# Patient Record
Sex: Female | Born: 1960 | Race: Black or African American | Hispanic: No | Marital: Married | Smoking: Never smoker
Health system: Southern US, Community
[De-identification: ages and names within clinical notes are randomized; demographics above are authoritative.]

## PROBLEM LIST (undated history)

## (undated) DIAGNOSIS — I10 Essential (primary) hypertension: Secondary | ICD-10-CM

## (undated) DIAGNOSIS — E785 Hyperlipidemia, unspecified: Secondary | ICD-10-CM

## (undated) DIAGNOSIS — G4730 Sleep apnea, unspecified: Secondary | ICD-10-CM

## (undated) DIAGNOSIS — F419 Anxiety disorder, unspecified: Secondary | ICD-10-CM

## (undated) DIAGNOSIS — D219 Benign neoplasm of connective and other soft tissue, unspecified: Secondary | ICD-10-CM

## (undated) HISTORY — DX: Sleep apnea, unspecified: G47.30

## (undated) HISTORY — DX: Anxiety disorder, unspecified: F41.9

## (undated) HISTORY — DX: Essential (primary) hypertension: I10

## (undated) HISTORY — DX: Benign neoplasm of connective and other soft tissue, unspecified: D21.9

## (undated) HISTORY — DX: Hyperlipidemia, unspecified: E78.5

---
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
## 1999-05-04 ENCOUNTER — Other Ambulatory Visit: Admission: RE | Admit: 1999-05-04 | Discharge: 1999-05-04 | Payer: Self-pay | Admitting: Gynecology

## 2000-12-30 ENCOUNTER — Encounter: Admission: RE | Admit: 2000-12-30 | Discharge: 2000-12-30 | Payer: Self-pay | Admitting: Gynecology

## 2000-12-30 ENCOUNTER — Encounter: Payer: Self-pay | Admitting: Gynecology

## 2000-12-30 ENCOUNTER — Other Ambulatory Visit: Admission: RE | Admit: 2000-12-30 | Discharge: 2000-12-30 | Payer: Self-pay | Admitting: Gynecology

## 2002-02-20 ENCOUNTER — Other Ambulatory Visit: Admission: RE | Admit: 2002-02-20 | Discharge: 2002-02-20 | Payer: Self-pay | Admitting: Gynecology

## 2003-03-16 ENCOUNTER — Other Ambulatory Visit: Admission: RE | Admit: 2003-03-16 | Discharge: 2003-03-16 | Payer: Self-pay | Admitting: Gynecology

## 2003-10-26 ENCOUNTER — Ambulatory Visit (HOSPITAL_BASED_OUTPATIENT_CLINIC_OR_DEPARTMENT_OTHER): Admission: RE | Admit: 2003-10-26 | Discharge: 2003-10-26 | Payer: Self-pay | Admitting: Otolaryngology

## 2003-11-17 ENCOUNTER — Encounter: Admission: RE | Admit: 2003-11-17 | Discharge: 2003-11-17 | Payer: Self-pay | Admitting: Gynecology

## 2004-12-12 ENCOUNTER — Ambulatory Visit: Payer: Self-pay | Admitting: Family Medicine

## 2005-01-22 ENCOUNTER — Ambulatory Visit: Payer: Self-pay | Admitting: Internal Medicine

## 2005-02-06 ENCOUNTER — Encounter: Admission: RE | Admit: 2005-02-06 | Discharge: 2005-02-06 | Payer: Self-pay | Admitting: Family Medicine

## 2005-04-23 ENCOUNTER — Ambulatory Visit: Payer: Self-pay | Admitting: Internal Medicine

## 2005-08-08 ENCOUNTER — Other Ambulatory Visit: Admission: RE | Admit: 2005-08-08 | Discharge: 2005-08-08 | Payer: Self-pay | Admitting: Gynecology

## 2005-09-27 ENCOUNTER — Ambulatory Visit: Payer: Self-pay | Admitting: Family Medicine

## 2006-01-01 ENCOUNTER — Ambulatory Visit: Payer: Self-pay | Admitting: Family Medicine

## 2006-02-19 ENCOUNTER — Encounter: Admission: RE | Admit: 2006-02-19 | Discharge: 2006-02-19 | Payer: Self-pay | Admitting: Family Medicine

## 2006-04-04 ENCOUNTER — Ambulatory Visit: Payer: Self-pay | Admitting: Family Medicine

## 2006-04-18 ENCOUNTER — Ambulatory Visit: Payer: Self-pay | Admitting: Family Medicine

## 2006-08-09 ENCOUNTER — Other Ambulatory Visit: Admission: RE | Admit: 2006-08-09 | Discharge: 2006-08-09 | Payer: Self-pay | Admitting: Gynecology

## 2006-09-24 ENCOUNTER — Ambulatory Visit: Payer: Self-pay | Admitting: Family Medicine

## 2007-02-20 DIAGNOSIS — E785 Hyperlipidemia, unspecified: Secondary | ICD-10-CM | POA: Insufficient documentation

## 2007-02-20 DIAGNOSIS — I10 Essential (primary) hypertension: Secondary | ICD-10-CM | POA: Insufficient documentation

## 2007-02-24 ENCOUNTER — Encounter: Admission: RE | Admit: 2007-02-24 | Discharge: 2007-02-24 | Payer: Self-pay | Admitting: Family Medicine

## 2007-02-24 ENCOUNTER — Ambulatory Visit: Payer: Self-pay | Admitting: Family Medicine

## 2007-06-09 ENCOUNTER — Ambulatory Visit: Payer: Self-pay | Admitting: Family Medicine

## 2007-06-09 DIAGNOSIS — H6120 Impacted cerumen, unspecified ear: Secondary | ICD-10-CM

## 2007-08-13 ENCOUNTER — Other Ambulatory Visit: Admission: RE | Admit: 2007-08-13 | Discharge: 2007-08-13 | Payer: Self-pay | Admitting: Gynecology

## 2008-01-23 ENCOUNTER — Telehealth (INDEPENDENT_AMBULATORY_CARE_PROVIDER_SITE_OTHER): Payer: Self-pay | Admitting: *Deleted

## 2008-01-30 ENCOUNTER — Ambulatory Visit: Payer: Self-pay | Admitting: Family Medicine

## 2008-02-25 ENCOUNTER — Ambulatory Visit: Payer: Self-pay | Admitting: Family Medicine

## 2008-02-25 DIAGNOSIS — R599 Enlarged lymph nodes, unspecified: Secondary | ICD-10-CM | POA: Insufficient documentation

## 2008-02-25 LAB — CONVERTED CEMR LAB
ALT: 28 units/L (ref 0–35)
Alkaline Phosphatase: 45 units/L (ref 39–117)
Basophils Absolute: 0 10*3/uL (ref 0.0–0.1)
Bilirubin, Direct: 0.1 mg/dL (ref 0.0–0.3)
CO2: 29 meq/L (ref 19–32)
Calcium: 9.2 mg/dL (ref 8.4–10.5)
Cholesterol: 226 mg/dL (ref 0–200)
Glucose, Bld: 119 mg/dL — ABNORMAL HIGH (ref 70–99)
HDL: 54.4 mg/dL (ref >39.0)
Lymphocytes Relative: 48 % — ABNORMAL HIGH (ref 12.0–46.0)
MCHC: 33.1 g/dL (ref 30.0–36.0)
Monocytes Relative: 6.3 % (ref 3.0–12.0)
Neutro Abs: 2.4 10*3/uL (ref 1.4–7.7)
Neutrophils Relative %: 42.3 % — ABNORMAL LOW (ref 43.0–77.0)
Platelets: 268 10*3/uL (ref 150–400)
Potassium: 3.9 meq/L (ref 3.5–5.1)
RDW: 13.3 % (ref 11.5–14.6)
Sodium: 140 meq/L (ref 135–145)
Total Bilirubin: 0.6 mg/dL (ref 0.3–1.2)
Total CHOL/HDL Ratio: 4.2
Total Protein: 7.1 g/dL (ref 6.0–8.3)
Triglycerides: 55 mg/dL (ref 0–149)
VLDL: 11 mg/dL (ref 0–40)

## 2008-02-26 ENCOUNTER — Encounter (INDEPENDENT_AMBULATORY_CARE_PROVIDER_SITE_OTHER): Payer: Self-pay | Admitting: *Deleted

## 2008-03-03 ENCOUNTER — Encounter: Admission: RE | Admit: 2008-03-03 | Discharge: 2008-03-03 | Payer: Self-pay | Admitting: Family Medicine

## 2008-03-04 ENCOUNTER — Telehealth (INDEPENDENT_AMBULATORY_CARE_PROVIDER_SITE_OTHER): Payer: Self-pay | Admitting: *Deleted

## 2008-05-24 ENCOUNTER — Ambulatory Visit: Payer: Self-pay | Admitting: Family Medicine

## 2008-06-04 ENCOUNTER — Encounter (INDEPENDENT_AMBULATORY_CARE_PROVIDER_SITE_OTHER): Payer: Self-pay | Admitting: *Deleted

## 2008-06-04 ENCOUNTER — Telehealth (INDEPENDENT_AMBULATORY_CARE_PROVIDER_SITE_OTHER): Payer: Self-pay | Admitting: *Deleted

## 2008-08-25 ENCOUNTER — Other Ambulatory Visit: Admission: RE | Admit: 2008-08-25 | Discharge: 2008-08-25 | Payer: Self-pay | Admitting: Gynecology

## 2008-08-25 ENCOUNTER — Ambulatory Visit: Payer: Self-pay | Admitting: Women's Health

## 2008-08-25 ENCOUNTER — Encounter: Payer: Self-pay | Admitting: Women's Health

## 2008-09-24 ENCOUNTER — Ambulatory Visit: Payer: Self-pay | Admitting: Women's Health

## 2008-10-11 ENCOUNTER — Ambulatory Visit: Payer: Self-pay | Admitting: Family Medicine

## 2008-10-13 ENCOUNTER — Ambulatory Visit: Payer: Self-pay | Admitting: Family Medicine

## 2008-10-14 ENCOUNTER — Telehealth (INDEPENDENT_AMBULATORY_CARE_PROVIDER_SITE_OTHER): Payer: Self-pay | Admitting: *Deleted

## 2008-10-31 LAB — CONVERTED CEMR LAB
Bilirubin, Direct: 0.1 mg/dL (ref 0.0–0.3)
Calcium: 9.4 mg/dL (ref 8.4–10.5)
GFR calc Af Amer: 87 mL/min
GFR calc non Af Amer: 72 mL/min
Glucose, Bld: 84 mg/dL (ref 70–99)
HDL: 56.7 mg/dL (ref >39.0)
LDL Cholesterol: 101 mg/dL — ABNORMAL HIGH (ref 0–99)
Sodium: 141 meq/L (ref 135–145)
Total CHOL/HDL Ratio: 3
Total Protein: 7.2 g/dL (ref 6.0–8.3)
VLDL: 10 mg/dL (ref 0–40)

## 2008-11-01 ENCOUNTER — Encounter (INDEPENDENT_AMBULATORY_CARE_PROVIDER_SITE_OTHER): Payer: Self-pay | Admitting: *Deleted

## 2008-11-11 ENCOUNTER — Ambulatory Visit: Payer: Self-pay | Admitting: Internal Medicine

## 2008-11-19 ENCOUNTER — Telehealth (INDEPENDENT_AMBULATORY_CARE_PROVIDER_SITE_OTHER): Payer: Self-pay | Admitting: *Deleted

## 2009-02-17 ENCOUNTER — Telehealth (INDEPENDENT_AMBULATORY_CARE_PROVIDER_SITE_OTHER): Payer: Self-pay | Admitting: *Deleted

## 2009-03-10 ENCOUNTER — Encounter: Admission: RE | Admit: 2009-03-10 | Discharge: 2009-03-10 | Payer: Self-pay | Admitting: Family Medicine

## 2009-04-21 ENCOUNTER — Ambulatory Visit: Payer: Self-pay | Admitting: Family Medicine

## 2009-04-21 DIAGNOSIS — F419 Anxiety disorder, unspecified: Secondary | ICD-10-CM

## 2009-04-24 LAB — CONVERTED CEMR LAB
Albumin: 4.5 g/dL (ref 3.5–5.2)
Bilirubin, Direct: 0.1 mg/dL (ref 0.0–0.3)
CO2: 28 meq/L (ref 19–32)
Calcium: 9.7 mg/dL (ref 8.4–10.5)
Creatinine, Ser: 0.9 mg/dL (ref 0.4–1.2)
Folate: >20 ng/mL
Glucose, Bld: 92 mg/dL (ref 70–99)
HDL: 54.1 mg/dL (ref >39.00)
Total CHOL/HDL Ratio: 3
Total Protein: 8 g/dL (ref 6.0–8.3)
Triglycerides: 50 mg/dL (ref 0.0–149.0)
Vitamin B-12: >1500 pg/mL — ABNORMAL HIGH (ref 211–911)

## 2009-04-25 ENCOUNTER — Encounter (INDEPENDENT_AMBULATORY_CARE_PROVIDER_SITE_OTHER): Payer: Self-pay | Admitting: *Deleted

## 2009-04-27 ENCOUNTER — Telehealth (INDEPENDENT_AMBULATORY_CARE_PROVIDER_SITE_OTHER): Payer: Self-pay | Admitting: *Deleted

## 2009-07-01 ENCOUNTER — Ambulatory Visit: Payer: Self-pay | Admitting: Family Medicine

## 2009-08-26 ENCOUNTER — Encounter: Payer: Self-pay | Admitting: Women's Health

## 2009-08-26 ENCOUNTER — Other Ambulatory Visit: Admission: RE | Admit: 2009-08-26 | Discharge: 2009-08-26 | Payer: Self-pay | Admitting: Gynecology

## 2009-08-26 ENCOUNTER — Ambulatory Visit: Payer: Self-pay | Admitting: Women's Health

## 2010-04-05 ENCOUNTER — Encounter: Admission: RE | Admit: 2010-04-05 | Discharge: 2010-04-05 | Payer: Self-pay | Admitting: Family Medicine

## 2010-06-20 ENCOUNTER — Telehealth (INDEPENDENT_AMBULATORY_CARE_PROVIDER_SITE_OTHER): Payer: Self-pay | Admitting: *Deleted

## 2010-06-26 ENCOUNTER — Ambulatory Visit: Payer: Self-pay | Admitting: Family Medicine

## 2010-08-01 ENCOUNTER — Ambulatory Visit: Payer: Self-pay | Admitting: Family Medicine

## 2010-08-02 ENCOUNTER — Encounter: Payer: Self-pay | Admitting: Family Medicine

## 2010-08-08 ENCOUNTER — Telehealth (INDEPENDENT_AMBULATORY_CARE_PROVIDER_SITE_OTHER): Payer: Self-pay | Admitting: *Deleted

## 2010-08-15 ENCOUNTER — Ambulatory Visit: Payer: Self-pay | Admitting: Family Medicine

## 2010-08-15 DIAGNOSIS — N390 Urinary tract infection, site not specified: Secondary | ICD-10-CM | POA: Insufficient documentation

## 2010-08-18 ENCOUNTER — Ambulatory Visit: Payer: Self-pay | Admitting: Family Medicine

## 2010-08-28 ENCOUNTER — Ambulatory Visit: Payer: Self-pay | Admitting: Women's Health

## 2010-08-28 ENCOUNTER — Other Ambulatory Visit: Admission: RE | Admit: 2010-08-28 | Discharge: 2010-08-28 | Payer: Self-pay | Admitting: Gynecology

## 2010-09-13 ENCOUNTER — Ambulatory Visit: Payer: Self-pay | Admitting: Women's Health

## 2010-12-10 LAB — CONVERTED CEMR LAB
ALT: 19 units/L (ref 0–35)
AST: 19 units/L (ref 0–37)
AST: 19 units/L (ref 0–37)
Albumin: 3.8 g/dL (ref 3.5–5.2)
Alkaline Phosphatase: 36 units/L — ABNORMAL LOW (ref 39–117)
Basophils Absolute: 0 10*3/uL (ref 0.0–0.1)
Basophils Absolute: 0 10*3/uL (ref 0.0–0.1)
Bilirubin, Direct: 0.1 mg/dL (ref 0.0–0.3)
CO2: 23 meq/L (ref 19–32)
CO2: 26 meq/L (ref 19–32)
Chloride: 109 meq/L (ref 96–112)
Chloride: 110 meq/L (ref 96–112)
Cholesterol: 217 mg/dL (ref 0–200)
Eosinophils Absolute: 0.2 10*3/uL (ref 0.0–0.7)
Glucose, Bld: 112 mg/dL — ABNORMAL HIGH (ref 70–99)
Glucose, Bld: 97 mg/dL (ref 70–99)
HCT: 35.8 % — ABNORMAL LOW (ref 36.0–46.0)
HDL: 50.9 mg/dL (ref >39.0)
Hemoglobin: 12.1 g/dL (ref 12.0–15.0)
Lymphocytes Relative: 41.5 % (ref 12.0–46.0)
Lymphs Abs: 2.6 10*3/uL (ref 0.7–4.0)
MCHC: 33.4 g/dL (ref 30.0–36.0)
MCHC: 33.8 g/dL (ref 30.0–36.0)
Monocytes Relative: 5.1 % (ref 3.0–12.0)
Neutro Abs: 3 10*3/uL (ref 1.4–7.7)
Neutrophils Relative %: 50.1 % (ref 43.0–77.0)
Pap Smear: NORMAL
Potassium: 4.2 meq/L (ref 3.5–5.1)
RBC: 4.5 M/uL (ref 3.87–5.11)
RDW: 12.9 % (ref 11.5–14.6)
RDW: 13.9 % (ref 11.5–14.6)
Sodium: 141 meq/L (ref 135–145)
Sodium: 142 meq/L (ref 135–145)
TSH: 1.4 microintl units/mL (ref 0.35–5.50)
TSH: 1.52 microintl units/mL (ref 0.35–5.50)
Total Bilirubin: 0.6 mg/dL (ref 0.3–1.2)
Total CHOL/HDL Ratio: 4.3
VLDL: 12 mg/dL (ref 0–40)

## 2010-12-12 NOTE — Progress Notes (Signed)
Summary: REFILL REQUEST  Phone Note Refill Request Message from:  Patient on June 20, 2010 9:43 AM  Refills Requested: Medication #1:  TOPROL XL 200 MG XR24H-TAB 1 by mouth once daily.   Dosage confirmed as above?Dosage Confirmed   Supply Requested: 1 month [facility]  Next Appointment Scheduled: SEPT 20TH 2011 Initial call taken by: Lavell Islam,  June 20, 2010 9:43 AM  Follow-up for Phone Call        pending cpx 08-01-10...Marland KitchenMarland KitchenFelecia Deloach CMA  June 20, 2010 12:59 PM     Prescriptions: TOPROL XL 200 MG XR24H-TAB (METOPROLOL SUCCINATE) 1 by mouth once daily  #30 x 0   Entered by:   Jeremy Johann CMA   Authorized by:   Loreen Freud DO   Signed by:   Jeremy Johann CMA on 06/20/2010   Method used:   Faxed to ...       [facility]  [place] [phone]* (retail)       [address]       [place], [place]  [address]       Ph: [phone]       Fax: [phone]   RxID:   [id number]

## 2010-12-12 NOTE — Assessment & Plan Note (Signed)
Summary: rto 2 weeks/cbs   Vital Signs:  Patient profile:   50 year old female Weight:      198.6 pounds Pulse rate:   84 / minute Pulse rhythm:   regular BP sitting:   132 / 84  (right arm) Cuff size:   large  Vitals Entered By: [name] CMA Duncan Dull) (August 15, 2010 3:24 PM) CC: 2 week f/u   History of Present Illness: Pt here f/u bp--no complaints.  Current Medications (verified): 1)  Cal Mag D .... 1 By Mouth Once Daily 2)  Mvi .Marland Kitchen.. 1 By Mouth Once Daily 3)  Vitamin C .... 1 By Mouth Once Daily 4)  Toprol Xl 200 Mg Xr24h-Tab (Metoprolol Succinate) .Marland Kitchen.. 1 By Mouth Once Daily 5)  Aspirin 81 Mg Chew (Aspirin) .... By Mouth Once Daily 6)  Coq10 100 Mg Caps (Coenzyme Q10) .... By Mouth Qd 7)  Macrobid 100 Mg Caps (Nitrofurantoin Monohyd Macro) .... Take One Tablet Two Times A Day X7 Days  Allergies (verified): No Known Drug Allergies  Past History:  Past medical, surgical, family and social histories (including risk factors) reviewed for relevance to current acute and chronic problems.  Past Medical History: Reviewed history from 04/21/2009 and no changes required. Hyperlipidemia Hypertension Anxiety  Past Surgical History: Reviewed history from 05/24/2008 and no changes required. R breast BX 1980  Family History: Reviewed history from 02/20/2007 and no changes required. Family History Thyroid disease, sister Family History Diabetes 1st degree relative, F Family History Hypertension, M Family History of Neurological disorder, Alz. dementia  Social History: Reviewed history from 05/24/2008 and no changes required. 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 Lungs are clear to auscultation, no crackles or wheezes. Heart:  Normal rate and regular rhythm. S1 and S2 normal without gallop, murmur, click, rub or other extra sounds. Skin:  Intact without suspicious lesions or rashes Cervical Nodes:  No lymphadenopathy noted Psych:  Cognition and judgment appear intact. Alert and cooperative with normal attention span and concentration. No apparent delusions, illusions, hallucinations   Impression & Recommendations:  Problem # 1:  HYPERTENSION (ICD-401.9)  Her updated medication list for this problem includes:    Toprol Xl 200 Mg Xr24h-tab (Metoprolol succinate) .Marland Kitchen... 1 by mouth once daily  BP today: 132/84 Prior BP: 140/90 (08/01/2010)  Labs Reviewed: K+: 4.2 (08/01/2010) Creat: : 0.8 (08/01/2010)   Chol: 234 (08/01/2010)   HDL: 57.20 (08/01/2010)   LDL: 79 (04/21/2009)   TG: 48.0 (08/01/2010)  Orders: UA Dipstick w/o Micro (manual) (52841)  Problem # 2:  UTI (ICD-599.0) Assessment: Improved  Her updated medication list for this problem includes:    Macrobid 100 Mg Caps (Nitrofurantoin monohyd macro) .Marland Kitchen... Take one tablet two times a day x7 days  Encouraged to push clear liquids, get enough rest, and take acetaminophen as needed. To be seen in 10 days if no improvement, sooner if worse.  Orders: UA Dipstick w/o Micro (manual) (32440)  Complete Medication List: 1)  Cal Mag D  .... 1 by mouth once daily 2)  Mvi  .Marland Kitchen.. 1 by mouth once daily 3)  Vitamin C  .... 1 by mouth once daily 4)  Toprol Xl 200 Mg Xr24h-tab (Metoprolol succinate) .Marland Kitchen.. 1 by mouth once daily  5)  Aspirin 81 Mg Chew (Aspirin) .... By mouth once daily 6)  Coq10 100 Mg Caps (Coenzyme q10) .... By mouth qd 7)  Macrobid 100 Mg Caps (Nitrofurantoin monohyd macro) .... Take one tablet two times a day x7 days  Patient Instructions: 1)  Please schedule a follow-up appointment in 3 months .   Appended Document: Orders Update    Clinical Lists  Changes  Observations: Added new observation of PH URINE: 5.0  (08/16/2010 8:39) Added new observation of SPEC GR URIN: 1.010  (08/16/2010 8:39) Added new observation of APPEARANCE U: Clear  (08/16/2010 8:39) Added new observation of UA COLOR: lt. yellow  (08/16/2010 8:39) Added new observation of WBC DIPSTK U: negative  (08/16/2010 8:39) Added new observation of NITRITE URN: negative  (08/16/2010 8:39) Added new observation of UROBILINOGEN: 0.2  (08/16/2010 8:39) Added new observation of PROTEIN, URN: trace  (08/16/2010 8:39) Added new observation of BLOOD UR DIP: negative  (08/16/2010 8:39) Added new observation of KETONES URN: negative  (08/16/2010 8:39) Added new observation of BILIRUBIN UR: negative  (08/16/2010 8:39) Added new observation of GLUCOSE, URN: negative  (08/16/2010 8:39)      Laboratory Results   Urine Tests    Routine Urinalysis   Color: lt. yellow Appearance: Clear Glucose: negative   (Normal Range: Negative) Bilirubin: negative   (Normal Range: Negative) Ketone: negative   (Normal Range: Negative) Spec. Gravity: 1.010   (Normal Range: 1.003-1.035) Blood: negative   (Normal Range: Negative) pH: 5.0   (Normal Range: 5.0-8.0) Protein: trace   (Normal Range: Negative) Urobilinogen: 0.2   (Normal Range: 0-1) Nitrite: negative   (Normal Range: Negative) Leukocyte Esterace: negative   (Normal Range: Negative)

## 2010-12-12 NOTE — Progress Notes (Signed)
Summary: change atb  Phone Note Call from Patient Call back at Work Phone [phone]   Caller: Patient Summary of Call: patient called says that she think cipro may be to strong would like to have another option to treat uti Initial call taken by: Doristine Devoid CMA,  August 08, 2010 1:31 PM  Follow-up for Phone Call        macrobid 1 by mouth two times a day for 7 days  Follow-up by: Loreen Freud DO,  August 08, 2010 1:32 PM  Additional Follow-up for Phone Call Additional follow up Details #1::        spoke w/ patient aware medication to be changed and sent to pharmacy .....Marland KitchenMarland KitchenDoristine Devoid CMA  August 08, 2010 1:49 PM     New/Updated Medications: MACROBID 100 MG CAPS (NITROFURANTOIN MONOHYD MACRO) take one tablet two times a day x7 days Prescriptions: MACROBID 100 MG CAPS (NITROFURANTOIN MONOHYD MACRO) take one tablet two times a day x7 days  #14 x 0   Entered by:   Doristine Devoid CMA   Authorized by:   Loreen Freud DO   Signed by:   Doristine Devoid CMA on 08/08/2010   Method used:   Electronically to        [id number]* (retail)       [address]       [place], [place]  [address]       Ph: [phone]       Fax: [phone]   RxID:   [id number]

## 2010-12-12 NOTE — Letter (Signed)
Summary: [facility]: Immunoassay Fecal Occult Blood (iFOB) Order Form  [facility] [place]  [address] [place], [place] [address]   Phone: [phone]  Fax: [phone]      [facility]: Immunoassay Fecal Occult Blood (iFOB) Order Form   August 01, 2010 MRN: [id number]   Jodi Rodriguez [birth year]   Physicican Name: Dr.Lowne  Diagnosis Code: V56.71      [name] CMA (AAMA)

## 2010-12-12 NOTE — Assessment & Plan Note (Signed)
Summary: BOTH EARS "CLOGGY"--NO DRAINAGE///SPH   Vital Signs:  Patient profile:   50 year old female Height:      61 inches (154.94 cm) Weight:      193.38 pounds (87.90 kg) BMI:     36.67 Temp:     99.2 degrees F (37.33 degrees C) oral BP sitting:   140 / 88  (right arm) Cuff size:   large  Vitals Entered By: Lucious Groves CMA (June 26, 2010 2:28 PM) CC: C/O ears being clogged x3-4 days./kb Is Patient Diabetic? No Pain Assessment Patient in pain? no      Comments Patient denies drainage, HA, and fever. Patient also notes that her Toprol is 200mg  and she is only taking half b/c she was previously on 100mg ./kb   History of Present Illness: 50 yo woman here today for 'ears are clogged'.  sxs started 2 weeks ago.  no drainage from the ears.  denies pain.  'they just feel full'.  no HAs, dizziness, nausea.  no hx of seasonal allergies.  no fevers, chills.  hx of cerumen impaction- feels similar.  R ear worse.  reviewed med list- pt was taking Toprol 200mg  daily.  will continue this.  Problems Prior to Update: 1)  Anxiety  (ICD-300.00) 2)  Preventive Health Care  (ICD-V70.0) 3)  Cervical Lymphadenopathy, Left  (ICD-785.6) 4)  Cerumen Impaction, Bilateral  (ICD-380.4) 5)  Cerumen Impaction, Right  (ICD-380.4) 6)  Family History Diabetes 1st Degree Relative  (ICD-V18.0) 7)  Breast Biopsy, Hx Of, R  (ICD-V15.9) 8)  Hypertension  (ICD-401.9) 9)  Hyperlipidemia  (ICD-272.4)  Current Medications (verified): 1)  Cal Mag D .... 1 By Mouth Once Daily 2)  Mvi .Marland Kitchen.. 1 By Mouth Once Daily 3)  Vitamin C .... 1 By Mouth Once Daily 4)  Toprol Xl 200 Mg Xr24h-Tab (Metoprolol Succinate) .Marland Kitchen.. 1 By Mouth Once Daily  Allergies (verified): No Known Drug Allergies  Past History:  Past Medical History: Last updated: 04/21/2009 Hyperlipidemia Hypertension Anxiety  Review of Systems      See HPI  Physical Exam  General:  Well-developed,well-nourished,in no acute distress;  alert,appropriate and cooperative throughout examination Ears:  ears b/l + cerumen---curette ineffective, irrigated with good success=== mild errythma of canal, TM's normal   Impression & Recommendations:  Problem # 1:  CERUMEN IMPACTION, BILATERAL (ICD-380.4) Assessment Unchanged  currette was unsuccessful, but ears irrigated successfully debrox as needed rto prn  Orders: Cerumen Impaction Removal ([address])  Problem # 2:  HYPERTENSION (ICD-401.9) Assessment: Unchanged reviewed med list, clarified for pt.  she will continue 200mg  daily Her updated medication list for this problem includes:    Toprol Xl 200 Mg Xr24h-tab (Metoprolol succinate) .Marland Kitchen... 1 by mouth once daily  Complete Medication List: 1)  Cal Mag D  .... 1 by mouth once daily 2)  Mvi  .Marland Kitchen.. 1 by mouth once daily 3)  Vitamin C  .... 1 by mouth once daily 4)  Toprol Xl 200 Mg Xr24h-tab (Metoprolol succinate) .Marland Kitchen.. 1 by mouth once daily  Patient Instructions: 1)  Follow up as needed or as scheduled per Dr Laury Axon 2)  Call with any questions or concerns 3)  Hang in there!!

## 2010-12-12 NOTE — Assessment & Plan Note (Signed)
Summary: CPX/FASTING//KN   Vital Signs:  Patient profile:   50 year old female Height:      61 inches Weight:      199.2 pounds Temp:     98.9 degrees F oral Pulse rate:   68 / minute Pulse rhythm:   regular BP sitting:   140 / 90  (right arm)  Vitals Entered By: [name] CMA Duncan Dull) (August 01, 2010 9:33 AM)  Serial Vital Signs/Assessments:  Time      Position  BP       Pulse  Resp  Temp     By 9:55 AM             160/100                        Loreen Freud DO  CC: cpx/fasting pap not needed   History of Present Illness: pt here for cpe and labs.  Pt sees [facility] gyn for pap etc.  No complaints.    Hyperlipidemia follow-up      This is a 50 year old woman who presents for Hyperlipidemia follow-up.  The patient denies muscle aches, GI upset, abdominal pain, flushing, itching, constipation, diarrhea, and fatigue.  The patient denies the following symptoms: chest pain/pressure, exercise intolerance, dypsnea, palpitations, syncope, and pedal edema.  Compliance with medications (by patient report) has been near 100%.  Dietary compliance has been good.  The patient reports no exercise.  Adjunctive measures currently used by the patient include ASA.    Hypertension follow-up      The patient also presents for Hypertension follow-up.  The patient denies lightheadedness, urinary frequency, headaches, edema, impotence, rash, and fatigue.  The patient denies the following associated symptoms: chest pain, chest pressure, exercise intolerance, dyspnea, palpitations, syncope, leg edema, and pedal edema.  Compliance with medications (by patient report) has been sporadic.  The patient reports that dietary compliance has been good.  The patient reports no exercise.  Adjunctive measures currently used by the patient include salt restriction.    Preventive Screening-Counseling & Management  Alcohol-Tobacco     Alcohol drinks/day: <1     Alcohol type: socially-- wine     Smoking Status:  never     Passive Smoke Exposure: no  Caffeine-Diet-Exercise     Caffeine use/day: 1     Does Patient Exercise: no     Exercise Counseling: to improve exercise regimen  Hep-HIV-STD-Contraception     HIV Risk: no     Dental Visit-last 6 months yes     Dental Care Counseling: to seek dental care; no dental care within six months     SBE monthly: yes     SBE Education/Counseling: not indicated; SBE done regularly  Safety-Violence-Falls     Seat Belt Use: 100      Sexual History:  currently monogamous.        Drug Use:  no.    Current Medications (verified): 1)  Cal Mag D .... 1 By Mouth Once Daily 2)  Mvi .Marland Kitchen.. 1 By Mouth Once Daily 3)  Vitamin C .... 1 By Mouth Once Daily 4)  Toprol Xl 200 Mg Xr24h-Tab (Metoprolol Succinate) .Marland Kitchen.. 1 By Mouth Once Daily 5)  Aspirin 81 Mg Chew (Aspirin) .... By Mouth Once Daily 6)  Coq10 100 Mg Caps (Coenzyme Q10) .... By Mouth Qd  Allergies (verified): No Known Drug Allergies  Past History:  Past Medical History: Last updated: 04/21/2009 Hyperlipidemia Hypertension Anxiety  Past  Surgical History: Last updated: 05/24/2008 R breast BX 1980  Family History: Last updated: 02/20/2007 Family History Thyroid disease, sister Family History Diabetes 1st degree relative, F Family History Hypertension, M Family History of Neurological disorder, Alz. 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 Family History Thyroid disease, sister Family History Diabetes 1st degree relative, F Family History Hypertension, M Family History of Neurological disorder,  Alz. dementia  Social History: Reviewed history from 05/24/2008 and no changes required. 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 Eyes:  Denies blurring, discharge, double vision, eye irritation, eye pain, halos, itching, light sensitivity, red eye, vision loss-1 eye, and vision loss-both eyes; optho--last one 3 years ago. ENT:  Denies decreased hearing, difficulty swallowing, ear discharge, earache, hoarseness, nasal congestion, nosebleeds, postnasal drainage, ringing in ears, sinus pressure, and sore throat. CV:  Denies bluish discoloration of lips or nails, chest pain or discomfort, difficulty breathing at night, difficulty breathing while lying down, fainting, fatigue, leg cramps with exertion, lightheadness, near fainting, palpitations, shortness of breath with exertion, swelling of feet, swelling of hands, and weight gain. Resp:  Denies chest discomfort, chest pain with inspiration, cough, coughing up blood, excessive snoring, hypersomnolence, morning headaches, pleuritic, shortness of breath, sputum productive, and wheezing. GI:  Denies abdominal pain, bloody stools, change in bowel habits, constipation, dark tarry stools, diarrhea, excessive appetite, gas, hemorrhoids, indigestion, loss of appetite, nausea, vomiting, vomiting blood, and yellowish skin color. GU:  Denies abnormal vaginal bleeding, decreased libido, discharge, dysuria, genital sores, hematuria, incontinence, nocturia, urinary frequency, and urinary hesitancy. MS:  Denies joint pain, joint redness, joint swelling, loss of strength, low back pain, mid back pain, muscle aches, muscle , cramps, muscle weakness,  stiffness, and thoracic pain. Derm:  Denies changes in color of skin, changes in nail beds, dryness, excessive perspiration, flushing, hair loss, insect bite(s), itching, lesion(s), poor wound healing, and rash. Neuro:  Denies brief paralysis, difficulty with concentration, disturbances in coordination, falling down, headaches, inability to speak, memory loss, numbness, poor balance, seizures, sensation of room spinning, tingling, tremors, visual disturbances, and weakness. Psych:  Denies alternate hallucination ( auditory/visual), anxiety, depression, easily angered, easily tearful, irritability, mental problems, panic attacks, sense of great danger, suicidal thoughts/plans, thoughts of violence, unusual visions or sounds, and thoughts /plans of harming others. Endo:  Denies cold intolerance, excessive hunger, excessive thirst, excessive urination, heat intolerance, polyuria, and weight change. Heme:  Denies abnormal bruising, bleeding, enlarge lymph nodes, fevers, pallor, and skin discoloration. Allergy:  Denies hives or rash, itching eyes, persistent infections, seasonal allergies, and sneezing.  Physical Exam  General:  Well-developed,well-nourished,in no acute distress; alert,appropriate and cooperative throughout examination Head:  Normocephalic and atraumatic without obvious abnormalities. No apparent alopecia or balding. Eyes:  vision grossly intact, pupils equal, pupils round, and pupils reactive to light.   Ears:  External ear exam shows no significant lesions or deformities.  Otoscopic examination reveals clear  canals, tympanic membranes are intact bilaterally without bulging, retraction, inflammation or discharge. Hearing is grossly normal bilaterally. Nose:  External nasal examination shows no deformity or inflammation. Nasal mucosa are pink and moist without lesions or exudates. Mouth:  Oral mucosa and oropharynx without lesions or exudates.  Teeth in good repair. Neck:  No deformities,  masses, or tenderness noted. Chest Wall:  No deformities, masses, or tenderness noted. Breasts:  gyn Lungs:  Normal respiratory effort, chest expands symmetrically. Lungs are clear to auscultation, no crackles or wheezes. Heart:  Normal rate and regular rhythm. S1 and S2 normal without gallop, murmur, click, rub or other extra sounds. Abdomen:  Bowel sounds positive,abdomen soft and non-tender without masses, organomegaly or hernias noted. Rectal:  gyn Genitalia:  gyn Msk:  normal ROM, no joint tenderness, no joint swelling, no joint warmth, no redness over joints, no joint deformities, no joint instability, and no crepitation.   Pulses:  R posterior tibial normal, R dorsalis pedis normal, R carotid normal, L posterior tibial normal, L dorsalis pedis normal, and L carotid normal.   Extremities:  No clubbing, cyanosis, edema, or deformity noted with normal full range of motion of all joints.   Neurologic:  No cranial nerve deficits noted. Station and gait are normal. Plantar reflexes are down-going bilaterally. DTRs are symmetrical throughout. Sensory, motor and coordinative functions appear intact. Skin:  Intact without suspicious lesions or rashes Cervical Nodes:  No lymphadenopathy noted Axillary Nodes:  No palpable lymphadenopathy Psych:  Cognition and judgment appear intact. Alert and cooperative with normal attention span and concentration. No apparent delusions, illusions, hallucinations   Impression & Recommendations:  Problem # 1:  PREVENTIVE HEALTH CARE (ICD-V70.0) ghm utd  Orders: Venipuncture (95621) TLB-Lipid Panel (80061-LIPID) TLB-BMP (Basic Metabolic Panel-BMET) (80048-METABOL) TLB-CBC Platelet - w/Differential (85025-CBCD) TLB-Hepatic/Liver Function Pnl (80076-HEPATIC) TLB-TSH (Thyroid Stimulating Hormone) (84443-TSH) Specimen Handling (30865) EKG w/ Interpretation (93000)  Problem # 2:  HYPERTENSION (ICD-401.9)  Her updated medication list for this problem  includes:    Toprol Xl 200 Mg Xr24h-tab (Metoprolol succinate) .Marland Kitchen... 1 by mouth once daily  Orders: Venipuncture (78469) TLB-Lipid Panel (80061-LIPID) TLB-BMP (Basic Metabolic Panel-BMET) (80048-METABOL) TLB-CBC Platelet - w/Differential (85025-CBCD) TLB-Hepatic/Liver Function Pnl (80076-HEPATIC) TLB-TSH (Thyroid Stimulating Hormone) (84443-TSH) Specimen Handling (62952) EKG w/ Interpretation (93000)  BP today: 140/90 Prior BP: 140/88 (06/26/2010)  Labs Reviewed: K+: 3.5 (04/21/2009) Creat: : 0.9 (04/21/2009)   Chol: 143 (04/21/2009)   HDL: 54.10 (04/21/2009)   LDL: 79 (04/21/2009)   TG: 50.0 (04/21/2009)  Problem # 3:  HYPERLIPIDEMIA (ICD-272.4)  Orders: Venipuncture (84132) TLB-Lipid Panel (80061-LIPID) TLB-BMP (Basic Metabolic Panel-BMET) (80048-METABOL) TLB-CBC Platelet - w/Differential (85025-CBCD) TLB-Hepatic/Liver Function Pnl (80076-HEPATIC) TLB-TSH (Thyroid Stimulating Hormone) (84443-TSH) Specimen Handling (44010) EKG w/ Interpretation (93000)  Labs Reviewed: SGOT: 41 (04/21/2009)   SGPT: 60 (04/21/2009)   HDL:54.10 (04/21/2009), 56.7 (10/13/2008)  LDL:79 (04/21/2009), 101 (27/25/3664)  Chol:143 (04/21/2009), 168 (10/13/2008)  Trig:50.0 (04/21/2009), 52 (10/13/2008)  Complete Medication List: 1)  Cal Mag D  .... 1 by mouth once daily 2)  Mvi  .Marland Kitchen.. 1 by mouth once daily 3)  Vitamin C  .... 1 by mouth once daily 4)  Toprol Xl 200 Mg Xr24h-tab (Metoprolol succinate) .Marland Kitchen.. 1 by mouth once daily 5)  Aspirin 81 Mg Chew (Aspirin) .... By mouth once daily 6)  Coq10 100 Mg Caps (Coenzyme q10) .... By mouth qd  Patient Instructions: 1)  Please schedule a follow-up appointment in 2 weeks.  Prescriptions: TOPROL XL 200 MG XR24H-TAB (METOPROLOL SUCCINATE) 1 by mouth  once daily  #90 x 1   Entered and Authorized by:   Loreen Freud DO   Signed by:   Loreen Freud DO on 08/01/2010   Method used:   Electronically to        [id number] (retail)        [address]       [place], [place]  [address]       Ph: [phone]       Fax: [phone]   RxID:   [id number]    EKG  Procedure date:  08/01/2010  Findings:      sinus arrhythmia  borderline 1st degree AV block  Flu Vaccine Next Due:  Refused Last PAP:  Normal (01/21/2008 9:42:28 AM) PAP Result Date:  07/27/2009 PAP Result:  normal PAP Next Due:  1 yr   Appended Document: CPX/FASTING//KN  Laboratory Results   Urine Tests   Date/Time Reported: August 01, 2010 1:02 PM   Routine Urinalysis   Color: yellow Appearance: Clear Glucose: negative   (Normal Range: Negative) Bilirubin: negative   (Normal Range: Negative) Ketone: negative   (Normal Range: Negative) Spec. Gravity: 1.020   (Normal Range: 1.003-1.035) Blood: large   (Normal Range: Negative) pH: 6.0   (Normal Range: 5.0-8.0) Protein: negative   (Normal Range: Negative) Urobilinogen: negative   (Normal Range: 0-1) Nitrite: negative   (Normal Range: Negative) Leukocyte Esterace: negative   (Normal Range: Negative)    Comments: Floydene Flock  August 01, 2010 1:03 PM pt w/menses

## 2011-02-26 ENCOUNTER — Other Ambulatory Visit: Payer: Self-pay

## 2011-02-26 MED ORDER — METOPROLOL SUCCINATE ER 200 MG PO TB24: 200.0000 mg | ORAL_TABLET | Freq: Every day | ORAL | Status: DC

## 2011-03-15 ENCOUNTER — Other Ambulatory Visit: Payer: Self-pay | Admitting: Family Medicine

## 2011-03-15 DIAGNOSIS — Z1231 Encounter for screening mammogram for malignant neoplasm of breast: Secondary | ICD-10-CM

## 2011-03-21 ENCOUNTER — Encounter: Payer: Self-pay | Admitting: Family Medicine

## 2011-03-22 ENCOUNTER — Ambulatory Visit (INDEPENDENT_AMBULATORY_CARE_PROVIDER_SITE_OTHER): Payer: BC Managed Care – PPO | Admitting: Family Medicine

## 2011-03-22 DIAGNOSIS — H6120 Impacted cerumen, unspecified ear: Secondary | ICD-10-CM

## 2011-03-22 NOTE — Assessment & Plan Note (Signed)
Wax removed bilaterally w/ irrigation.  Hearing now normal.  TMs normal.  No pain.

## 2011-03-22 NOTE — Patient Instructions (Signed)
Follow up w/ Dr Laury Axon sometime in the next 3 months to recheck blood pressure Call w/ any questions or concerns Hang in there!

## 2011-03-22 NOTE — Progress Notes (Signed)
  Subjective:    Patient ID: Jodi Rodriguez, female    DOB: December 01, 1960, 50 y.o.   MRN: [id number]  HPI Cerumen impaction- R>L, decreased hearing started Monday but 'they've been sticky' for the last few weeks'.  Recurrent problem for pt.  No pain.   Review of Systems For ROS see HPI     Objective:   Physical Exam  Constitutional: She appears well-developed and well-nourished. No distress.  HENT:  Head: Normocephalic and atraumatic.  Right Ear: Decreased hearing is noted.  Left Ear: Decreased hearing is noted.       TMs obscured by soft wax bilaterally After irrigation TMs normal bilaterally          Assessment & Plan:

## 2011-04-10 ENCOUNTER — Ambulatory Visit
Admission: RE | Admit: 2011-04-10 | Discharge: 2011-04-10 | Disposition: A | Discharge status: Home / Self Care | Payer: BC Managed Care – PPO | Source: Ambulatory Visit | Attending: Family Medicine | Admitting: Family Medicine

## 2011-04-10 DIAGNOSIS — Z1231 Encounter for screening mammogram for malignant neoplasm of breast: Secondary | ICD-10-CM

## 2011-06-11 ENCOUNTER — Other Ambulatory Visit: Payer: Self-pay | Admitting: *Deleted

## 2011-06-11 MED ORDER — METOPROLOL SUCCINATE ER 200 MG PO TB24: 200.0000 mg | ORAL_TABLET | Freq: Every day | ORAL | Status: DC

## 2011-06-11 NOTE — Telephone Encounter (Signed)
Pt left VM that she ran out of med this weekend and mail order is not to arrive until Thursday. Pt would like to know if we have samples of this med that she can have. Pt advise that we do not sample this med but I can send Rx to local pharmacy. Pt ok Rx sent to local pharmacy.

## 2011-08-15 ENCOUNTER — Other Ambulatory Visit: Payer: Self-pay | Admitting: Family Medicine

## 2011-08-15 NOTE — Telephone Encounter (Signed)
Leer mailed to schedule CPE     KP

## 2011-08-22 DIAGNOSIS — D219 Benign neoplasm of connective and other soft tissue, unspecified: Secondary | ICD-10-CM | POA: Insufficient documentation

## 2011-08-31 ENCOUNTER — Encounter: Payer: Self-pay | Admitting: Women's Health

## 2011-08-31 ENCOUNTER — Ambulatory Visit (INDEPENDENT_AMBULATORY_CARE_PROVIDER_SITE_OTHER): Payer: BC Managed Care – PPO | Admitting: Women's Health

## 2011-08-31 ENCOUNTER — Other Ambulatory Visit (HOSPITAL_COMMUNITY)
Admission: RE | Admit: 2011-08-31 | Discharge: 2011-08-31 | Disposition: A | Discharge status: Home / Self Care | Payer: BC Managed Care – PPO | Source: Ambulatory Visit | Attending: Women's Health | Admitting: Women's Health

## 2011-08-31 VITALS — BP 130/82 | Ht 61.25 in | Wt 205.0 lb

## 2011-08-31 DIAGNOSIS — Z01419 Encounter for gynecological examination (general) (routine) without abnormal findings: Secondary | ICD-10-CM | POA: Insufficient documentation

## 2011-08-31 NOTE — Progress Notes (Signed)
Jodi Rodriguez [birth year] [id number]    History:    The patient presents for annual exam.  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 Son at [employer] on football scholarship. Daughter junior high school doing well. Both have received Gardasil.  Past medical history, past surgical history, family history and social history were all reviewed and documented in the EPIC chart.   ROS:  A  ROS was performed and pertinent positives and negatives are included in the history.  Exam:  Filed Vitals:   08/31/11 0945  BP: 130/82    General appearance:  Normal Head/Neck:  Normal, without cervical or supraclavicular adenopathy. Thyroid:  Symmetrical, normal in size, without palpable masses or nodularity. Respiratory  Effort:  Normal  Auscultation:  Clear without wheezing or rhonchi Cardiovascular  Auscultation:  Regular rate, without rubs, murmurs or gallops  Edema/varicosities:  Not grossly evident Abdominal  Soft,nontender, without masses, guarding or rebound.  Liver/spleen:  No organomegaly noted  Hernia:  None appreciated  Skin  Inspection:  Grossly normal  Palpation:  Grossly normal Neurologic/psychiatric  Orientation:  Normal with appropriate conversation.  Mood/affect:  Normal  Genitourinary    Breasts: Examined lying and sitting.     Right: Without masses, retractions, discharge or axillary adenopathy.     Left: Without masses, retractions, discharge or axillary adenopathy.   Inguinal/mons:  Normal without inguinal adenopathy  External genitalia:  Normal  BUS/Urethra/Skene's glands:  Normal  Bladder:  Normal  Vagina:  Normal  Cervix:  Normal  Uterus:  Enlarged/fibroids 16 wk size  Midline and mobile  Adnexa/parametria:     Rt: Without masses or tenderness.   Lt: Without masses or tenderness.  Anus and perineum: Normal  Digital rectal exam: Normal sphincter tone without palpated masses or tenderness  Assessment/Plan:  50 y.o. MBF G2P2 for annual exam monthly  4-6 day cycle/vasectomy. States get a cycle in September, denies any menopausal symptoms at this time. Menopause reviewed.  Fibroid uterus Obesity Hypertension  Plan: Labs and medications at her primary care. Pap only. SBEs, annual mammogram which was normal in May. Reviewed importance of exercise, cutting calories for weight loss. Encouraged calcium rich diet, vitamin D 1000 daily.    Harrington Challenger WHNP, 1:09 PM 08/31/2011

## 2011-10-08 ENCOUNTER — Encounter: Payer: Self-pay | Admitting: Family Medicine

## 2011-10-08 ENCOUNTER — Ambulatory Visit (INDEPENDENT_AMBULATORY_CARE_PROVIDER_SITE_OTHER): Payer: BC Managed Care – PPO | Admitting: Family Medicine

## 2011-10-08 VITALS — BP 124/82 | HR 70 | Temp 98.3°F | Ht 62.5 in | Wt 208.0 lb

## 2011-10-08 DIAGNOSIS — I10 Essential (primary) hypertension: Secondary | ICD-10-CM

## 2011-10-08 DIAGNOSIS — Z0000 Encounter for general adult medical examination without abnormal findings: Secondary | ICD-10-CM

## 2011-10-08 LAB — TSH: TSH: 1.85 u[IU]/mL (ref 0.35–5.50)

## 2011-10-08 LAB — LIPID PANEL
HDL: 60.2 mg/dL (ref >39.00)
Total CHOL/HDL Ratio: 4
VLDL: 12.6 mg/dL (ref 0.0–40.0)

## 2011-10-08 LAB — CBC WITH DIFFERENTIAL/PLATELET
Basophils Absolute: 0 10*3/uL (ref 0.0–0.1)
Hemoglobin: 12.4 g/dL (ref 12.0–15.0)
Lymphocytes Relative: 42.9 % (ref 12.0–46.0)
Monocytes Relative: 6.2 % (ref 3.0–12.0)
Neutro Abs: 2.7 10*3/uL (ref 1.4–7.7)
Neutrophils Relative %: 48 % (ref 43.0–77.0)
Platelets: 246 10*3/uL (ref 150.0–400.0)
RDW: 14 % (ref 11.5–14.6)

## 2011-10-08 LAB — POCT URINALYSIS DIPSTICK
Glucose, UA: NEGATIVE
Nitrite, UA: NEGATIVE
Protein, UA: NEGATIVE
Urobilinogen, UA: 0.2

## 2011-10-08 LAB — LDL CHOLESTEROL, DIRECT: Direct LDL: 185.6 mg/dL

## 2011-10-08 LAB — BASIC METABOLIC PANEL
Calcium: 9 mg/dL (ref 8.4–10.5)
Creatinine, Ser: 0.8 mg/dL (ref 0.4–1.2)
GFR: 106 mL/min (ref >60.00)
Glucose, Bld: 97 mg/dL (ref 70–99)
Sodium: 140 mEq/L (ref 135–145)

## 2011-10-08 LAB — HEPATIC FUNCTION PANEL
AST: 19 U/L (ref 0–37)
Alkaline Phosphatase: 46 U/L (ref 39–117)
Bilirubin, Direct: 0 mg/dL (ref 0.0–0.3)
Total Bilirubin: 0.6 mg/dL (ref 0.3–1.2)

## 2011-10-08 MED ORDER — METOPROLOL SUCCINATE ER 200 MG PO TB24: ORAL_TABLET | ORAL | Status: DC

## 2011-10-08 NOTE — Patient Instructions (Signed)

## 2011-10-08 NOTE — Progress Notes (Signed)
  Subjective:     Jodi Rodriguez is a 50 y.o. female and is here for a comprehensive physical exam. The patient reports no problems.  History   Social History  . Marital Status: Married    Spouse Name: N/A    Number of Children: N/A  . Years of Education: N/A   Occupational History  . Insurance    Social History Main Topics  . Smoking status: Never Smoker   . Smokeless tobacco: Never Used  . Alcohol Use: Yes  . Drug Use: No  . Sexually Active: Yes -- Female partner(s)    Birth Control/ Protection: Surgical     vasectomy   Other Topics Concern  . Not on file   Social History Narrative  . No narrative on file   Health Maintenance  Topic Date Due  . Influenza Vaccine  08/12/2012  . Pap Smear  08/30/2014  . Tetanus/tdap  05/24/2018    The following portions of the patient's history were reviewed and updated as appropriate: allergies, current medications, past family history, past medical history, past social history, past surgical history and problem list.  Review of Systems Review of Systems  Constitutional: Negative for activity change, appetite change and fatigue.  HENT: Negative for hearing loss, congestion, tinnitus and ear discharge.  dentist q2m Eyes: Negative for visual disturbance (see optho q2y)  Respiratory: Negative for cough, chest tightness and shortness of breath.   Cardiovascular: Negative for chest pain, palpitations and leg swelling.  Gastrointestinal: Negative for abdominal pain, diarrhea, constipation and abdominal distention.  Genitourinary: Negative for urgency, frequency, decreased urine volume and difficulty urinating.  Musculoskeletal: Negative for back pain, arthralgias and gait problem.  Skin: Negative for color change, pallor and rash.  Neurological: Negative for dizziness, light-headedness, numbness and headaches.  Hematological: Negative for adenopathy. Does not bruise/bleed easily.  Psychiatric/Behavioral: Negative for suicidal ideas,  confusion, sleep disturbance, self-injury, dysphoric mood, decreased concentration and agitation.       Objective:    BP 124/82  Pulse 70  Temp(Src) 98.3 F (36.8 C) (Oral)  Ht 5' 2.5" (1.588 m)  Wt 208 lb (94.348 kg)  BMI 37.44 kg/m2  SpO2 97% General appearance: alert, cooperative, appears stated age and moderately obese Head: Normocephalic, without obvious abnormality, atraumatic Eyes: conjunctivae/corneas clear. PERRL, EOM's intact. Fundi benign. Ears: normal TM's and external ear canals both ears--b/l cerumena impaction--irrigated with no complications Nose: Nares normal. Septum midline. Mucosa normal. No drainage or sinus tenderness. Throat: lips, mucosa, and tongue normal; teeth and gums normal Neck: no adenopathy, no carotid bruit, no JVD, supple, symmetrical, trachea midline and thyroid not enlarged, symmetric, no tenderness/mass/nodules Back: symmetric, no curvature. ROM normal. No CVA tenderness. Lungs: clear to auscultation bilaterally Breasts: gyn--Fernandez Heart: regular rate and rhythm, S1, S2 normal, no murmur, click, rub or gallop Abdomen: soft, non-tender; bowel sounds normal; no masses,  no organomegaly Pelvic: gyn Extremities: extremities normal, atraumatic, no cyanosis or edema Pulses: 2+ and symmetric Skin: Skin color, texture, turgor normal. No rashes or lesions Lymph nodes: Cervical, supraclavicular, and axillary nodes normal. Neurologic: Alert and oriented X 3, normal strength and tone. Normal symmetric reflexes. Normal coordination and gait psych--- no depression/ anxiety    Assessment:    Healthy female exam.   HTN--stable     Plan:  Check fasting labs ghm utd    See After Visit Summary for Counseling Recommendations

## 2011-10-18 MED ORDER — ATORVASTATIN CALCIUM 20 MG PO TABS: 20.0000 mg | ORAL_TABLET | Freq: Every day | ORAL | Status: AC

## 2011-10-31 ENCOUNTER — Telehealth: Payer: Self-pay | Admitting: Family Medicine

## 2011-10-31 DIAGNOSIS — I10 Essential (primary) hypertension: Secondary | ICD-10-CM

## 2011-10-31 MED ORDER — METOPROLOL SUCCINATE ER 200 MG PO TB24: ORAL_TABLET | ORAL | Status: DC

## 2011-10-31 NOTE — Telephone Encounter (Signed)
Patient had cpx last month she thought she said to sent refill for topral to [facility]  - 3 month supply

## 2011-10-31 NOTE — Telephone Encounter (Signed)
RE-FAXED     KP

## 2012-03-26 ENCOUNTER — Other Ambulatory Visit: Payer: Self-pay | Admitting: Family Medicine

## 2012-03-26 DIAGNOSIS — Z1231 Encounter for screening mammogram for malignant neoplasm of breast: Secondary | ICD-10-CM

## 2012-04-10 ENCOUNTER — Ambulatory Visit
Admission: RE | Admit: 2012-04-10 | Discharge: 2012-04-10 | Disposition: A | Discharge status: Home / Self Care | Payer: BC Managed Care – PPO | Source: Ambulatory Visit | Attending: Family Medicine | Admitting: Family Medicine

## 2012-04-10 DIAGNOSIS — Z1231 Encounter for screening mammogram for malignant neoplasm of breast: Secondary | ICD-10-CM

## 2012-08-07 ENCOUNTER — Ambulatory Visit (INDEPENDENT_AMBULATORY_CARE_PROVIDER_SITE_OTHER): Payer: BC Managed Care – PPO | Admitting: Family Medicine

## 2012-08-07 ENCOUNTER — Encounter: Payer: Self-pay | Admitting: Family Medicine

## 2012-08-07 VITALS — BP 122/80 | HR 70 | Temp 98.4°F | Wt 187.6 lb

## 2012-08-07 DIAGNOSIS — H6120 Impacted cerumen, unspecified ear: Secondary | ICD-10-CM

## 2012-08-07 NOTE — Progress Notes (Signed)
  Subjective:    Patient ID: Jodi Rodriguez, female    DOB: 1960/11/20, 51 y.o.   MRN: [id number]  HPI Pt here c/o both ears being clogged.  She periodically comes in to have her ears cleaned out.  No other complaints.     Review of Systems As above    Objective:   Physical Exam  Constitutional: She appears well-developed and well-nourished.  HENT:       B/l cerumen impaction.  Ears soaked and irrigated with good results Canals and TM normal b/l           Assessment & Plan:  Cerumen impaction--- irrigated with no complication                                       No infection / irritation                         rto prn

## 2012-08-10 NOTE — Patient Instructions (Signed)
Cerumen Impaction  A cerumen impaction is when the wax in your ear forms a plug. This plug usually causes reduced hearing. Sometimes it also causes an earache or dizziness. Removing a cerumen impaction can be difficult and painful. The wax sticks to the ear canal. The canal is sensitive and bleeds easily. If you try to remove a heavy wax buildup with a cotton tipped swab, you may push it in further.  Irrigation with water, suction, and small ear curettes may be used to clear out the wax. If the impaction is fixed to the skin in the ear canal, ear drops may be needed for a few days to loosen the wax. People who build up a lot of wax frequently can use ear wax removal products available in your local drugstore.  SEEK MEDICAL CARE IF:    You develop an earache, increased hearing loss, or marked dizziness.  Document Released: 12/06/2004 Document Revised: 10/18/2011 Document Reviewed: 01/26/2010  Exit[employer] Patient Information 2012 [employer].

## 2012-10-15 ENCOUNTER — Other Ambulatory Visit: Payer: Self-pay | Admitting: Family Medicine

## 2012-10-15 NOTE — Telephone Encounter (Signed)
Rx sent.    MW 

## 2013-01-06 ENCOUNTER — Ambulatory Visit (INDEPENDENT_AMBULATORY_CARE_PROVIDER_SITE_OTHER): Payer: BC Managed Care – PPO | Admitting: Gynecology

## 2013-01-06 ENCOUNTER — Encounter: Payer: Self-pay | Admitting: Gynecology

## 2013-01-06 VITALS — BP 128/86

## 2013-01-06 DIAGNOSIS — N920 Excessive and frequent menstruation with regular cycle: Secondary | ICD-10-CM

## 2013-01-06 DIAGNOSIS — D219 Benign neoplasm of connective and other soft tissue, unspecified: Secondary | ICD-10-CM

## 2013-01-06 MED ORDER — CLINDAMYCIN PHOSPHATE 2 % VA CREA: 1.0000 | TOPICAL_CREAM | Freq: Every day | VAGINAL | Status: DC

## 2013-01-06 NOTE — Patient Instructions (Addendum)
Endometrial Ablation Endometrial ablation removes the lining of the uterus (endometrium). It is usually a same day, outpatient treatment. Ablation helps avoid major surgery (such as a hysterectomy). A hysterectomy is removal of the cervix and uterus. Endometrial ablation has less risk and complications, has a shorter recovery period and is less expensive. After endometrial ablation, most women will have little or no menstrual bleeding. You may not keep your fertility. Pregnancy is no longer likely after this procedure but if you are pre-menopausal, you still need to use a reliable method of birth control following the procedure because pregnancy can occur. REASONS TO HAVE THE PROCEDURE MAY INCLUDE:  Heavy periods.  Bleeding that is causing anemia.  Anovulatory bleeding, very irregular, bleeding.  Bleeding submucous fibroids (on the lining inside the uterus) if they are smaller than 3 centimeters. REASONS NOT TO HAVE THE PROCEDURE MAY INCLUDE:  You wish to have more children.  You have a pre-cancerous or cancerous problem. The cause of any abnormal bleeding must be diagnosed before having the procedure.  You have pain coming from the uterus.  You have a submucus fibroid larger than 3 centimeters.  You recently had a baby.  You recently had an infection in the uterus.  You have a severe retro-flexed, tipped uterus and cannot insert the instrument to do the ablation.  You had a Cesarean section or deep major surgery on the uterus.  The inner cavity of the uterus is too large for the endometrial ablation instrument. RISKS AND COMPLICATIONS   Perforation of the uterus.  Bleeding.  Infection of the uterus, bladder or vagina.  Injury to surrounding organs.  Cutting the cervix.  An air bubble to the lung (air embolus).  Pregnancy following the procedure.  Failure of the procedure to help the problem requiring hysterectomy.  Decreased ability to diagnose cancer in the lining  of the uterus. BEFORE THE PROCEDURE  The lining of the uterus must be tested to make sure there is no pre-cancerous or cancer cells present.  Medications may be given to make the lining of the uterus thinner.  Ultrasound may be used to evaluate the size and look for abnormalities of the uterus.  Future pregnancy is not desired. PROCEDURE  There are different ways to destroy the lining of the uterus.   Resectoscope - radio frequency-alternating electric current is the most common one used.  Cryotherapy - freezing the lining of the uterus.  Heated Free Liquid - heated salt (saline) solution inserted into the uterus.  Microwave - uses high energy microwaves in the uterus.  Thermal Balloon - a catheter with a balloon tip is inserted into the uterus and filled with heated fluid. Your caregiver will talk with you about the method used in this clinic. They will also instruct you on the pros and cons of the procedure. Endometrial ablation is performed along with a procedure called operative hysteroscopy. A narrow viewing tube is inserted through the birth canal (vagina) and through the cervix into the uterus. A tiny camera attached to the viewing tube (hysteroscope) allows the uterine cavity to be shown on a TV monitor during surgery. Your uterus is filled with a harmless liquid to make the procedure easier. The lining of the uterus is then removed. The lining can also be removed with a resectoscope which allows your surgeon to cut away the lining of the uterus under direct vision. Usually, you will be able to go home within an hour after the procedure. HOME CARE INSTRUCTIONS   Do  not drive for 24 hours.  No tampons, douching or intercourse for 2 weeks or until your caregiver approves.  Rest at home for 24 to 48 hours. You may then resume normal activities unless told differently by your caregiver.  Take your temperature two times a day for 4 days, and record it.  Take any medications your  caregiver has ordered, as directed.  Use some form of contraception if you are pre-menopausal and do not want to get pregnant. Bleeding after the procedure is normal. It varies from light spotting and mildly watery to bloody discharge for 4 to 6 weeks. You may also have mild cramping. Only take over-the-counter or prescription medicines for pain, discomfort, or fever as directed by your caregiver. Do not use aspirin, as this may aggravate bleeding. Frequent urination during the first 24 hours is normal. You will not know how effective your surgery is until at least 3 months after the surgery. SEEK IMMEDIATE MEDICAL CARE IF:   Bleeding is heavier than a normal menstrual cycle.  An oral temperature above 102 F (38.9 C) develops.  You have increasing cramps or pains not relieved with medication or develop belly (abdominal) pain which does not seem to be related to the same area of earlier cramping and pain.  You are light headed, weak or have fainting episodes.  You develop pain in the shoulder strap areas.  You have chest or leg pain.  You have abnormal vaginal discharge.  You have painful urination. Document Released: 09/07/2004 Document Revised: 01/21/2012 Document Reviewed: 12/06/2007 [facility] Patient Information 2013 [place], [place].  Fibroids Fibroids are lumps (tumors) that can occur any place in a woman's body. These lumps are not cancerous. Fibroids vary in size, weight, and where they grow. HOME CARE  Do not take aspirin.  Write down the number of pads or tampons you use during your period. Tell your doctor. This can help determine the best treatment for you. GET HELP RIGHT AWAY IF:  You have pain in your lower belly (abdomen) that is not helped with medicine.  You have cramps that are not helped with medicine.  You have more bleeding between or during your period.  You feel lightheaded or pass out (faint).  Your lower belly pain gets worse. MAKE SURE  YOU:  Understand these instructions.  Will watch your condition.  Will get help right away if you are not doing well or get worse. Document Released: 12/01/2010 Document Revised: 01/21/2012 Document Reviewed: 12/01/2010 [facility] Patient Information 2013 [place], [place].

## 2013-01-06 NOTE — Progress Notes (Signed)
Patient is a 52 year old who presented to the office today stating that she was unable to remove her menstrual Diva Cup and wanted assistance in removing it today. Patient's husband has had a vasectomy.  Exam: Bartholin urethra Skene was within normal limits Vagina: Blood present in the vault, the Diva Cup was removed area that patient did not want a discarded she wants to use it later.  Cervix: No lesions or discharge only menstrual blood Uterus: Irregular shaped 12 week size Adnexa: Limited assessment due to the size of her uterus. Rectal exam: Not done  Assessment/plan:Diva Cup was removed. I had inform patient that since she has history of menorrhagia whereby she bleeds heavily for 5-7 days to consider ablation or a Mirena IUD. I've recommended also that she have an ultrasound to compare with the ultrasound scan of 2011 which demonstrated that her uterus was 12 weeks size and she had several intramural myomas were by the largest one was for and a half centimeters in size as well as to better assess her adnexa.patient will review the material and let us know in the near future. She scheduled for annual exam later in the year.

## 2013-02-20 ENCOUNTER — Encounter: Payer: Self-pay | Admitting: Family Medicine

## 2013-02-20 ENCOUNTER — Ambulatory Visit (INDEPENDENT_AMBULATORY_CARE_PROVIDER_SITE_OTHER): Payer: BC Managed Care – PPO | Admitting: Family Medicine

## 2013-02-20 VITALS — BP 130/80 | HR 80 | Temp 99.0°F | Wt 189.0 lb

## 2013-02-20 DIAGNOSIS — H6120 Impacted cerumen, unspecified ear: Secondary | ICD-10-CM

## 2013-02-20 DIAGNOSIS — H6123 Impacted cerumen, bilateral: Secondary | ICD-10-CM

## 2013-02-20 NOTE — Patient Instructions (Signed)
Cerumen Impaction  A cerumen impaction is when the wax in your ear forms a plug. This plug usually causes reduced hearing. Sometimes it also causes an earache or dizziness. Removing a cerumen impaction can be difficult and painful. The wax sticks to the ear canal. The canal is sensitive and bleeds easily. If you try to remove a heavy wax buildup with a cotton tipped swab, you may push it in further.  Irrigation with water, suction, and small ear curettes may be used to clear out the wax. If the impaction is fixed to the skin in the ear canal, ear drops may be needed for a few days to loosen the wax. People who build up a lot of wax frequently can use ear wax removal products available in your local drugstore.  SEEK MEDICAL CARE IF:    You develop an earache, increased hearing loss, or marked dizziness.  Document Released: 12/06/2004 Document Revised: 01/21/2012 Document Reviewed: 01/26/2010  [employer] Patient Information 2013 [employer].

## 2013-02-20 NOTE — Progress Notes (Signed)
  Subjective:    Patient ID: Jodi Rodriguez, female    DOB: 1960/12/28, 52 y.o.   MRN: [id number]  HPI Pt here c/o ears feeling clogged and dec hearing.  No other complaints.    Review of Systems As above    Objective:   Physical Exam  BP 130/80  Pulse 80  Temp(Src) 99 F (37.2 C) (Oral)  Wt 189 lb (85.73 kg)  BMI 34 kg/m2  SpO2 98% General appearance: alert, cooperative, appears stated age and no distress Ears: b/l cerumen impaction--- irrigated successfully and TMI b/l        Assessment & Plan:  Cerumen impaction-----  Pt instructed to use debrox prn                                              rto prn

## 2013-02-25 ENCOUNTER — Encounter: Payer: BC Managed Care – PPO | Admitting: Women's Health

## 2013-03-09 ENCOUNTER — Encounter: Payer: Self-pay | Admitting: Women's Health

## 2013-03-09 ENCOUNTER — Ambulatory Visit (INDEPENDENT_AMBULATORY_CARE_PROVIDER_SITE_OTHER): Payer: BC Managed Care – PPO | Admitting: Women's Health

## 2013-03-09 VITALS — BP 122/74 | Ht 61.5 in | Wt 190.0 lb

## 2013-03-09 DIAGNOSIS — Z01419 Encounter for gynecological examination (general) (routine) without abnormal findings: Secondary | ICD-10-CM

## 2013-03-09 NOTE — Progress Notes (Signed)
Jodi Rodriguez February 20, 1961 [id number]    History:    The patient presents for annual exam. Montlhly cycle/vasectomy  with occasional menopausal symptoms/vasectomy. HTN- PC labs and meds.  Normal paps and mammograms.  Benign breast bx 09.  Fibroid uterus.  Past medical history, past surgical history, family history and social history were all reviewed and documented in the EPIC chart. Eric at [employer], playing football, [employer] 20 going to [employer]. 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 Parents deceased, both HTN, F-DM, M-Alzheimer.   ROS:  A  ROS was performed and pertinent positives and negatives are included in the history.  Exam:  Filed Vitals:   03/09/13 1015  BP: 122/74    General appearance:  Normal Head/Neck:  Normal, without cervical or supraclavicular adenopathy. Thyroid:  Symmetrical, normal in size, without palpable masses or nodularity. Respiratory  Effort:  Normal  Auscultation:  Clear without wheezing or rhonchi Cardiovascular  Auscultation:  Regular rate, without rubs, murmurs or gallops  Edema/varicosities:  Not grossly evident Abdominal  Soft,nontender, without masses, guarding or rebound.  Liver/spleen:  No organomegaly noted  Hernia:  None appreciated  Skin  Inspection:  Grossly normal  Palpation:  Grossly normal Neurologic/psychiatric  Orientation:  Normal with appropriate conversation.  Mood/affect:  Normal  Genitourinary    Breasts: Examined lying and sitting.     Right: Without masses, retractions, discharge or axillary adenopathy.     Left: Without masses, retractions, discharge or axillary adenopathy.   Inguinal/mons:  Normal without inguinal adenopathy  External genitalia:  Normal  BUS/Urethra/Skene's glands:  Normal  Bladder:  Normal  Vagina:  Normal  Cervix:  Normal  Uterus:  Enlarged 14 week size non tender.  Midline and mobile  Adnexa/parametria:     Rt: Without masses or tenderness.   Lt: Without masses or tenderness.  Anus  and perineum: Normal  Digital rectal exam: Normal sphincter tone without palpated masses or tenderness  Assessment/Plan:  52 y.o. MBF G2P2 for annual exam with no complaints.  HTN PC labs and meds Fibroid uterus/asymptomatic vasectomy  Plan: SBE's, continue annual mammogram, calcium rich diet, vit D 2000 daily.  Continue healthy diet exercise, has lost 15 pounds.Pap normal 09/2011, new pap screening guidelines reviewed.    Harrington Challenger [facility], 7:26 PM 03/09/2013

## 2013-03-13 ENCOUNTER — Encounter: Payer: Self-pay | Admitting: Women's Health

## 2013-07-28 ENCOUNTER — Encounter: Payer: Self-pay | Admitting: Family Medicine

## 2013-07-28 ENCOUNTER — Ambulatory Visit (INDEPENDENT_AMBULATORY_CARE_PROVIDER_SITE_OTHER): Payer: BC Managed Care – PPO | Admitting: Family Medicine

## 2013-07-28 VITALS — BP 126/74 | HR 62 | Temp 98.3°F | Ht 63.0 in | Wt 204.8 lb

## 2013-07-28 DIAGNOSIS — E785 Hyperlipidemia, unspecified: Secondary | ICD-10-CM

## 2013-07-28 DIAGNOSIS — F411 Generalized anxiety disorder: Secondary | ICD-10-CM

## 2013-07-28 DIAGNOSIS — I10 Essential (primary) hypertension: Secondary | ICD-10-CM

## 2013-07-28 DIAGNOSIS — E669 Obesity, unspecified: Secondary | ICD-10-CM | POA: Insufficient documentation

## 2013-07-28 DIAGNOSIS — Z0000 Encounter for general adult medical examination without abnormal findings: Secondary | ICD-10-CM

## 2013-07-28 LAB — CBC WITH DIFFERENTIAL/PLATELET
Basophils Relative: 0.4 % (ref 0.0–3.0)
Eosinophils Relative: 2.7 % (ref 0.0–5.0)
Hemoglobin: 12.9 g/dL (ref 12.0–15.0)
Lymphocytes Relative: 45.9 % (ref 12.0–46.0)
Neutrophils Relative %: 45 % (ref 43.0–77.0)
RBC: 4.54 Mil/uL (ref 3.87–5.11)
WBC: 6.2 10*3/uL (ref 4.5–10.5)

## 2013-07-28 LAB — POCT URINALYSIS DIPSTICK
Bilirubin, UA: NEGATIVE
Glucose, UA: NEGATIVE
Ketones, UA: NEGATIVE
Leukocytes, UA: NEGATIVE
Protein, UA: NEGATIVE

## 2013-07-28 LAB — HEPATIC FUNCTION PANEL
ALT: 15 U/L (ref 0–35)
AST: 16 U/L (ref 0–37)
Alkaline Phosphatase: 50 U/L (ref 39–117)
Bilirubin, Direct: 0 mg/dL (ref 0.0–0.3)
Total Protein: 7.3 g/dL (ref 6.0–8.3)

## 2013-07-28 LAB — BASIC METABOLIC PANEL
Calcium: 8.8 mg/dL (ref 8.4–10.5)
Chloride: 107 mEq/L (ref 96–112)
Creatinine, Ser: 0.9 mg/dL (ref 0.4–1.2)
Sodium: 139 mEq/L (ref 135–145)

## 2013-07-28 LAB — TSH: TSH: 1.4 u[IU]/mL (ref 0.35–5.50)

## 2013-07-28 LAB — MICROALBUMIN / CREATININE URINE RATIO
Creatinine,U: 58.7 mg/dL
Microalb Creat Ratio: 0.5 mg/g (ref 0.0–30.0)

## 2013-07-28 MED ORDER — METOPROLOL SUCCINATE ER 200 MG PO TB24: ORAL_TABLET | ORAL | Status: DC

## 2013-07-28 NOTE — Assessment & Plan Note (Signed)
con't med Check labs 

## 2013-07-28 NOTE — Addendum Note (Signed)
Addended by: Silvio Pate D on: 07/28/2013 02:49 PM   Modules accepted: Orders

## 2013-07-28 NOTE — Assessment & Plan Note (Signed)
Check labs con't meds 

## 2013-07-28 NOTE — Patient Instructions (Addendum)
Preventive Care for Adults, Female A healthy lifestyle and preventive care can promote health and wellness. Preventive health guidelines for women include the following key practices.  A routine yearly physical is a good way to check with your caregiver about your health and preventive screening. It is a chance to share any concerns and updates on your health, and to receive a thorough exam.  Visit your dentist for a routine exam and preventive care every 6 months. Brush your teeth twice a day and floss once a day. Good oral hygiene prevents tooth decay and gum disease.  The frequency of eye exams is based on your age, health, family medical history, use of contact lenses, and other factors. Follow your caregiver's recommendations for frequency of eye exams.  Eat a healthy diet. Foods like vegetables, fruits, whole grains, low-fat dairy products, and lean protein foods contain the nutrients you need without too many calories. Decrease your intake of foods high in solid fats, added sugars, and salt. Eat the right amount of calories for you.Get information about a proper diet from your caregiver, if necessary.  Regular physical exercise is one of the most important things you can do for your health. Most adults should get at least 150 minutes of moderate-intensity exercise (any activity that increases your heart rate and causes you to sweat) each week. In addition, most adults need muscle-strengthening exercises on 2 or more days a week.  Maintain a healthy weight. The body mass index (BMI) is a screening tool to identify possible weight problems. It provides an estimate of body fat based on height and weight. Your caregiver can help determine your BMI, and can help you achieve or maintain a healthy weight.For adults 20 years and older:  A BMI below 18.5 is considered underweight.  A BMI of 18.5 to 24.9 is normal.  A BMI of 25 to 29.9 is considered overweight.  A BMI of 30 and above is  considered obese.  Maintain normal blood lipids and cholesterol levels by exercising and minimizing your intake of saturated fat. Eat a balanced diet with plenty of fruit and vegetables. Blood tests for lipids and cholesterol should begin at age 20 and be repeated every 5 years. If your lipid or cholesterol levels are high, you are over 50, or you are at high risk for heart disease, you may need your cholesterol levels checked more frequently.Ongoing high lipid and cholesterol levels should be treated with medicines if diet and exercise are not effective.  If you smoke, find out from your caregiver how to quit. If you do not use tobacco, do not start.  If you are pregnant, do not drink alcohol. If you are breastfeeding, be very cautious about drinking alcohol. If you are not pregnant and choose to drink alcohol, do not exceed 1 drink per day. One drink is considered to be 12 ounces (355 mL) of beer, 5 ounces (148 mL) of wine, or 1.5 ounces (44 mL) of liquor.  Avoid use of street drugs. Do not share needles with anyone. Ask for help if you need support or instructions about stopping the use of drugs.  High blood pressure causes heart disease and increases the risk of stroke. Your blood pressure should be checked at least every 1 to 2 years. Ongoing high blood pressure should be treated with medicines if weight loss and exercise are not effective.  If you are 55 to 50 years old, ask your caregiver if you should take aspirin to prevent strokes.  Diabetes   screening involves taking a blood sample to check your fasting blood sugar level. This should be done once every 3 years, after age 45, if you are within normal weight and without risk factors for diabetes. Testing should be considered at a younger age or be carried out more frequently if you are overweight and have at least 1 risk factor for diabetes.  Breast cancer screening is essential preventive care for women. You should practice "breast  self-awareness." This means understanding the normal appearance and feel of your breasts and may include breast self-examination. Any changes detected, no matter how small, should be reported to a caregiver. Women in their 20s and 30s should have a clinical breast exam (CBE) by a caregiver as part of a regular health exam every 1 to 3 years. After age 40, women should have a CBE every year. Starting at age 40, women should consider having a mammography (breast X-ray test) every year. Women who have a family history of breast cancer should talk to their caregiver about genetic screening. Women at a high risk of breast cancer should talk to their caregivers about having magnetic resonance imaging (MRI) and a mammography every year.  The Pap test is a screening test for cervical cancer. A Pap test can show cell changes on the cervix that might become cervical cancer if left untreated. A Pap test is a procedure in which cells are obtained and examined from the lower end of the uterus (cervix).  Women should have a Pap test starting at age 21.  Between ages 21 and 29, Pap tests should be repeated every 2 years.  Beginning at age 30, you should have a Pap test every 3 years as long as the past 3 Pap tests have been normal.  Some women have medical problems that increase the chance of getting cervical cancer. Talk to your caregiver about these problems. It is especially important to talk to your caregiver if a new problem develops soon after your last Pap test. In these cases, your caregiver may recommend more frequent screening and Pap tests.  The above recommendations are the same for women who have or have not gotten the vaccine for human papillomavirus (HPV).  If you had a hysterectomy for a problem that was not cancer or a condition that could lead to cancer, then you no longer need Pap tests. Even if you no longer need a Pap test, a regular exam is a good idea to make sure no other problems are  starting.  If you are between ages 65 and 70, and you have had normal Pap tests going back 10 years, you no longer need Pap tests. Even if you no longer need a Pap test, a regular exam is a good idea to make sure no other problems are starting.  If you have had past treatment for cervical cancer or a condition that could lead to cancer, you need Pap tests and screening for cancer for at least 20 years after your treatment.  If Pap tests have been discontinued, risk factors (such as a new sexual partner) need to be reassessed to determine if screening should be resumed.  The HPV test is an additional test that may be used for cervical cancer screening. The HPV test looks for the virus that can cause the cell changes on the cervix. The cells collected during the Pap test can be tested for HPV. The HPV test could be used to screen women aged 30 years and older, and should   be used in women of any age who have unclear Pap test results. After the age of 30, women should have HPV testing at the same frequency as a Pap test.  Colorectal cancer can be detected and often prevented. Most routine colorectal cancer screening begins at the age of 50 and continues through age 75. However, your caregiver may recommend screening at an earlier age if you have risk factors for colon cancer. On a yearly basis, your caregiver may provide home test kits to check for hidden blood in the stool. Use of a small camera at the end of a tube, to directly examine the colon (sigmoidoscopy or colonoscopy), can detect the earliest forms of colorectal cancer. Talk to your caregiver about this at age 50, when routine screening begins. Direct examination of the colon should be repeated every 5 to 10 years through age 75, unless early forms of pre-cancerous polyps or small growths are found.  Hepatitis C blood testing is recommended for all people born from 1945 through 1965 and any individual with known risks for hepatitis C.  Practice  safe sex. Use condoms and avoid high-risk sexual practices to reduce the spread of sexually transmitted infections (STIs). STIs include gonorrhea, chlamydia, syphilis, trichomonas, herpes, HPV, and human immunodeficiency virus (HIV). Herpes, HIV, and HPV are viral illnesses that have no cure. They can result in disability, cancer, and death. Sexually active women aged 25 and younger should be checked for chlamydia. Older women with new or multiple partners should also be tested for chlamydia. Testing for other STIs is recommended if you are sexually active and at increased risk.  Osteoporosis is a disease in which the bones lose minerals and strength with aging. This can result in serious bone fractures. The risk of osteoporosis can be identified using a bone density scan. Women ages 65 and over and women at risk for fractures or osteoporosis should discuss screening with their caregivers. Ask your caregiver whether you should take a calcium supplement or vitamin D to reduce the rate of osteoporosis.  Menopause can be associated with physical symptoms and risks. Hormone replacement therapy is available to decrease symptoms and risks. You should talk to your caregiver about whether hormone replacement therapy is right for you.  Use sunscreen with sun protection factor (SPF) of 30 or more. Apply sunscreen liberally and repeatedly throughout the day. You should seek shade when your shadow is shorter than you. Protect yourself by wearing long sleeves, pants, a wide-brimmed hat, and sunglasses year round, whenever you are outdoors.  Once a month, do a whole body skin exam, using a mirror to look at the skin on your back. Notify your caregiver of new moles, moles that have irregular borders, moles that are larger than a pencil eraser, or moles that have changed in shape or color.  Stay current with required immunizations.  Influenza. You need a dose every fall (or winter). The composition of the flu vaccine  changes each year, so being vaccinated once is not enough.  Pneumococcal polysaccharide. You need 1 to 2 doses if you smoke cigarettes or if you have certain chronic medical conditions. You need 1 dose at age 65 (or older) if you have never been vaccinated.  Tetanus, diphtheria, pertussis (Tdap, Td). Get 1 dose of Tdap vaccine if you are younger than age 65, are over 65 and have contact with an infant, are a healthcare worker, are pregnant, or simply want to be protected from whooping cough. After that, you need a Td   booster dose every 10 years. Consult your caregiver if you have not had at least 3 tetanus and diphtheria-containing shots sometime in your life or have a deep or dirty wound.  HPV. You need this vaccine if you are a woman age 26 or younger. The vaccine is given in 3 doses over 6 months.  Measles, mumps, rubella (MMR). You need at least 1 dose of MMR if you were born in 1957 or later. You may also need a second dose.  Meningococcal. If you are age 19 to 21 and a first-year college student living in a residence hall, or have one of several medical conditions, you need to get vaccinated against meningococcal disease. You may also need additional booster doses.  Zoster (shingles). If you are age 60 or older, you should get this vaccine.  Varicella (chickenpox). If you have never had chickenpox or you were vaccinated but received only 1 dose, talk to your caregiver to find out if you need this vaccine.  Hepatitis A. You need this vaccine if you have a specific risk factor for hepatitis A virus infection or you simply wish to be protected from this disease. The vaccine is usually given as 2 doses, 6 to 18 months apart.  Hepatitis B. You need this vaccine if you have a specific risk factor for hepatitis B virus infection or you simply wish to be protected from this disease. The vaccine is given in 3 doses, usually over 6 months. Preventive Services / Frequency Ages 19 to 39  Blood  pressure check.** / Every 1 to 2 years.  Lipid and cholesterol check.** / Every 5 years beginning at age 20.  Clinical breast exam.** / Every 3 years for women in their 20s and 30s.  Pap test.** / Every 2 years from ages 21 through 29. Every 3 years starting at age 30 through age 65 or 70 with a history of 3 consecutive normal Pap tests.  HPV screening.** / Every 3 years from ages 30 through ages 65 to 70 with a history of 3 consecutive normal Pap tests.  Hepatitis C blood test.** / For any individual with known risks for hepatitis C.  Skin self-exam. / Monthly.  Influenza immunization.** / Every year.  Pneumococcal polysaccharide immunization.** / 1 to 2 doses if you smoke cigarettes or if you have certain chronic medical conditions.  Tetanus, diphtheria, pertussis (Tdap, Td) immunization. / A one-time dose of Tdap vaccine. After that, you need a Td booster dose every 10 years.  HPV immunization. / 3 doses over 6 months, if you are 26 and younger.  Measles, mumps, rubella (MMR) immunization. / You need at least 1 dose of MMR if you were born in 1957 or later. You may also need a second dose.  Meningococcal immunization. / 1 dose if you are age 19 to 21 and a first-year college student living in a residence hall, or have one of several medical conditions, you need to get vaccinated against meningococcal disease. You may also need additional booster doses.  Varicella immunization.** / Consult your caregiver.  Hepatitis A immunization.** / Consult your caregiver. 2 doses, 6 to 18 months apart.  Hepatitis B immunization.** / Consult your caregiver. 3 doses usually over 6 months. Ages 40 to 64  Blood pressure check.** / Every 1 to 2 years.  Lipid and cholesterol check.** / Every 5 years beginning at age 20.  Clinical breast exam.** / Every year after age 40.  Mammogram.** / Every year beginning at age 40   and continuing for as long as you are in good health. Consult with your  caregiver.  Pap test.** / Every 3 years starting at age 30 through age 65 or 70 with a history of 3 consecutive normal Pap tests.  HPV screening.** / Every 3 years from ages 30 through ages 65 to 70 with a history of 3 consecutive normal Pap tests.  Fecal occult blood test (FOBT) of stool. / Every year beginning at age 50 and continuing until age 75. You may not need to do this test if you get a colonoscopy every 10 years.  Flexible sigmoidoscopy or colonoscopy.** / Every 5 years for a flexible sigmoidoscopy or every 10 years for a colonoscopy beginning at age 50 and continuing until age 75.  Hepatitis C blood test.** / For all people born from 1945 through 1965 and any individual with known risks for hepatitis C.  Skin self-exam. / Monthly.  Influenza immunization.** / Every year.  Pneumococcal polysaccharide immunization.** / 1 to 2 doses if you smoke cigarettes or if you have certain chronic medical conditions.  Tetanus, diphtheria, pertussis (Tdap, Td) immunization.** / A one-time dose of Tdap vaccine. After that, you need a Td booster dose every 10 years.  Measles, mumps, rubella (MMR) immunization. / You need at least 1 dose of MMR if you were born in 1957 or later. You may also need a second dose.  Varicella immunization.** / Consult your caregiver.  Meningococcal immunization.** / Consult your caregiver.  Hepatitis A immunization.** / Consult your caregiver. 2 doses, 6 to 18 months apart.  Hepatitis B immunization.** / Consult your caregiver. 3 doses, usually over 6 months. Ages 65 and over  Blood pressure check.** / Every 1 to 2 years.  Lipid and cholesterol check.** / Every 5 years beginning at age 20.  Clinical breast exam.** / Every year after age 40.  Mammogram.** / Every year beginning at age 40 and continuing for as long as you are in good health. Consult with your caregiver.  Pap test.** / Every 3 years starting at age 30 through age 65 or 70 with a 3  consecutive normal Pap tests. Testing can be stopped between 65 and 70 with 3 consecutive normal Pap tests and no abnormal Pap or HPV tests in the past 10 years.  HPV screening.** / Every 3 years from ages 30 through ages 65 or 70 with a history of 3 consecutive normal Pap tests. Testing can be stopped between 65 and 70 with 3 consecutive normal Pap tests and no abnormal Pap or HPV tests in the past 10 years.  Fecal occult blood test (FOBT) of stool. / Every year beginning at age 50 and continuing until age 75. You may not need to do this test if you get a colonoscopy every 10 years.  Flexible sigmoidoscopy or colonoscopy.** / Every 5 years for a flexible sigmoidoscopy or every 10 years for a colonoscopy beginning at age 50 and continuing until age 75.  Hepatitis C blood test.** / For all people born from 1945 through 1965 and any individual with known risks for hepatitis C.  Osteoporosis screening.** / A one-time screening for women ages 65 and over and women at risk for fractures or osteoporosis.  Skin self-exam. / Monthly.  Influenza immunization.** / Every year.  Pneumococcal polysaccharide immunization.** / 1 dose at age 65 (or older) if you have never been vaccinated.  Tetanus, diphtheria, pertussis (Tdap, Td) immunization. / A one-time dose of Tdap vaccine if you are over   65 and have contact with an infant, are a healthcare worker, or simply want to be protected from whooping cough. After that, you need a Td booster dose every 10 years.  Varicella immunization.** / Consult your caregiver.  Meningococcal immunization.** / Consult your caregiver.  Hepatitis A immunization.** / Consult your caregiver. 2 doses, 6 to 18 months apart.  Hepatitis B immunization.** / Check with your caregiver. 3 doses, usually over 6 months. ** Family history and personal history of risk and conditions may change your caregiver's recommendations. Document Released: 12/25/2001 Document Revised: 01/21/2012  Document Reviewed: 03/26/2011 [employer] Patient Information 2014 [employer].  

## 2013-07-28 NOTE — Progress Notes (Signed)
Subjective:     Jodi Rodriguez is a 52 y.o. female and is here for a comprehensive physical exam. The patient reports no problems.  History   Social History  . Marital Status: Married    Spouse Name: N/A    Number of Children: N/A  . Years of Education: N/A   Occupational History  . Insurance    Social History Main Topics  . Smoking status: Never Smoker   . Smokeless tobacco: Never Used  . Alcohol Use: Yes     Comment: occass one weekly  . Drug Use: No  . Sexual Activity: Yes    Partners: Male    Birth Control/ Protection: Other-see comments     Comment: vasectomy   Other Topics Concern  . Not on file   Social History Narrative   Exercise-- 3x a week -- walk 1 mile on treadmill or stationary bike   Health Maintenance  Topic Date Due  . Mammogram  04/10/2013  . Influenza Vaccine  07/28/2014  . Pap Smear  03/26/2016  . Tetanus/tdap  05/24/2018    The following portions of the patient's history were reviewed and updated as appropriate:  She  has a past medical history of Hyperlipidemia; Anxiety; Hypertension; and Fibroid. She  does not have any pertinent problems on file. She  has past surgical history that includes Breast biopsy (1981). Her family history includes Alzheimer's disease in her mother and another family member; Diabetes in her father; Hypertension in her father, mother, and sister; Thyroid disease in her sister. She  reports that she has never smoked. She has never used smokeless tobacco. She reports that  drinks alcohol. She reports that she does not use illicit drugs. She has a current medication list which includes the following prescription(s): ascorbic acid, aspirin, metoprolol, and multivitamin. Current Outpatient Prescriptions on File Prior to Visit  Medication Sig Dispense Refill  . Ascorbic Acid (VITAMIN C PO) Take by mouth daily.        Marland Kitchen aspirin 81 MG chewable tablet Chew 81 mg by mouth daily.        . Multiple Vitamin (MULTIVITAMIN)  tablet Take 1 tablet by mouth daily.         No current facility-administered medications on file prior to visit.   She has No Known Allergies..  Review of Systems Review of Systems  Constitutional: Negative for activity change, appetite change and fatigue.  HENT: Negative for hearing loss, congestion, tinnitus and ear discharge.  dentist q76m Eyes: Negative for visual disturbance (see optho q1y -- vision corrected to 20/20 with glasses).  Respiratory: Negative for cough, chest tightness and shortness of breath.   Cardiovascular: Negative for chest pain, palpitations and leg swelling.  Gastrointestinal: Negative for abdominal pain, diarrhea, constipation and abdominal distention.  Genitourinary: Negative for urgency, frequency, decreased urine volume and difficulty urinating.  Musculoskeletal: Negative for back pain, arthralgias and gait problem.  Skin: Negative for color change, pallor and rash.  Neurological: Negative for dizziness, light-headedness, numbness and headaches.  Hematological: Negative for adenopathy. Does not bruise/bleed easily.  Psychiatric/Behavioral: Negative for suicidal ideas, confusion, sleep disturbance, self-injury, dysphoric mood, decreased concentration and agitation.       Objective:    BP 126/74  Pulse 62  Temp(Src) 98.3 F (36.8 C) (Oral)  Ht 5\' 3"  (1.6 m)  Wt 204 lb 12.8 oz (92.897 kg)  BMI 36.29 kg/m2  SpO2 99% General appearance: alert, cooperative, appears stated age and no distress Head: Normocephalic, without obvious abnormality, atraumatic  Eyes: conjunctivae/corneas clear. PERRL, EOM's intact. Fundi benign. Ears: + cerumen ---removed some with hoop but it was deep, R>L,  canals normal Nose: Nares normal. Septum midline. Mucosa normal. No drainage or sinus tenderness. Throat: lips, mucosa, and tongue normal; teeth and gums normal Neck: no adenopathy, no carotid bruit, no JVD, supple, symmetrical, trachea midline and thyroid not enlarged,  symmetric, no tenderness/mass/nodules Back: symmetric, no curvature. ROM normal. No CVA tenderness. Lungs: clear to auscultation bilaterally Breasts: gyn Heart: regular rate and rhythm, S1, S2 normal, no murmur, click, rub or gallop Abdomen: soft, non-tender; bowel sounds normal; no masses,  no organomegaly Pelvic: deferred -- gyn Extremities: extremities normal, atraumatic, no cyanosis or edema Pulses: 2+ and symmetric Skin: Skin color, texture, turgor normal. No rashes or lesions Lymph nodes: Cervical, supraclavicular, and axillary nodes normal. Neurologic: Alert and oriented X 3, normal strength and tone. Normal symmetric reflexes. Normal coordination and gait Psych-- no depression, no anxiety      Assessment:    Healthy female exam.      Plan:    ghm utd Check labs See After Visit Summary for Counseling Recommendations

## 2013-07-28 NOTE — Assessment & Plan Note (Signed)
stable °

## 2013-07-30 ENCOUNTER — Encounter: Payer: Self-pay | Admitting: Family Medicine

## 2013-07-30 ENCOUNTER — Ambulatory Visit (INDEPENDENT_AMBULATORY_CARE_PROVIDER_SITE_OTHER): Payer: BC Managed Care – PPO | Admitting: Family Medicine

## 2013-07-30 VITALS — BP 142/90 | HR 67 | Temp 98.7°F | Resp 12 | Wt 203.0 lb

## 2013-07-30 DIAGNOSIS — H6120 Impacted cerumen, unspecified ear: Secondary | ICD-10-CM

## 2013-07-30 DIAGNOSIS — H6123 Impacted cerumen, bilateral: Secondary | ICD-10-CM

## 2013-07-30 NOTE — Assessment & Plan Note (Signed)
Irrigated successfully  rto prn 

## 2013-07-30 NOTE — Progress Notes (Signed)
  Subjective:    Patient ID: Jodi Rodriguez, female    DOB: 08-Sep-1961, 52 y.o.   MRN: [id number]  HPI Pt here c/o ears feeling clogged.   No other complaints   Review of Systems As above    Objective:   Physical Exam BP 142/90  Pulse 67  Temp(Src) 98.7 F (37.1 C) (Oral)  Resp 12  Wt 203 lb (92.08 kg)  BMI 35.97 kg/m2  SpO2 97% General appearance: alert, cooperative, appears stated age and no distress Ears: + cerumen impaction b/l----  unable to remove with hoop--- irrigated successfully       Assessment & Plan:

## 2013-09-04 ENCOUNTER — Encounter: Payer: Self-pay | Admitting: Nurse Practitioner

## 2013-09-04 ENCOUNTER — Encounter: Payer: BC Managed Care – PPO | Admitting: Nurse Practitioner

## 2013-09-07 NOTE — Progress Notes (Signed)
This encounter was created in error - please disregard.

## 2013-09-09 ENCOUNTER — Ambulatory Visit: Payer: BC Managed Care – PPO | Admitting: Family Medicine

## 2013-09-09 DIAGNOSIS — Z0289 Encounter for other administrative examinations: Secondary | ICD-10-CM

## 2013-11-25 ENCOUNTER — Ambulatory Visit (INDEPENDENT_AMBULATORY_CARE_PROVIDER_SITE_OTHER): Payer: BC Managed Care – PPO | Admitting: Family Medicine

## 2013-11-25 ENCOUNTER — Encounter: Payer: Self-pay | Admitting: Family Medicine

## 2013-11-25 VITALS — BP 138/76 | HR 82 | Temp 98.3°F | Wt 192.0 lb

## 2013-11-25 DIAGNOSIS — S61209A Unspecified open wound of unspecified finger without damage to nail, initial encounter: Secondary | ICD-10-CM

## 2013-11-25 DIAGNOSIS — L089 Local infection of the skin and subcutaneous tissue, unspecified: Secondary | ICD-10-CM

## 2013-11-25 MED ORDER — CEPHALEXIN 500 MG PO CAPS: 500.0000 mg | ORAL_CAPSULE | Freq: Two times a day (BID) | ORAL | Status: DC

## 2013-11-25 NOTE — Patient Instructions (Signed)
Fingertip Infection When an infection is around the nail, it is called a paronychia. When it appears over the tip of the finger, it is called a felon. These infections are due to minor injuries or cracks in the skin. If they are not treated properly, they can lead to bone infection and permanent damage to the fingernail. Incision and drainage is necessary if a pus pocket (an abscess) has formed. Antibiotics and pain medicine may also be needed. Keep your hand elevated for the next 2-3 days to reduce swelling and pain. If a pack was placed in the abscess, it should be removed in 1-2 days by your caregiver. Soak the finger in warm water for 20 minutes 4 times daily to help promote drainage. Keep the hands as dry as possible. Wear protective gloves with cotton liners. See your caregiver for follow-up care as recommended.  HOME CARE INSTRUCTIONS   Keep wound clean, dry and dressed as suggested by your caregiver.  Soak in warm salt water for fifteen minutes, four times per day for bacterial infections.  Your caregiver will prescribe an antibiotic if a bacterial infection is suspected. Take antibiotics as directed and finish the prescription, even if the problem appears to be improving before the medicine is gone.  Only take over-the-counter or prescription medicines for pain, discomfort, or fever as directed by your caregiver. SEEK IMMEDIATE MEDICAL CARE IF:  There is redness, swelling, or increasing pain in the wound.  Pus or any other unusual drainage is coming from the wound.  An unexplained oral temperature above 102 F (38.9 C) develops.  You notice a foul smell coming from the wound or dressing. MAKE SURE YOU:   Understand these instructions.  Monitor your condition.  Contact your caregiver if you are getting worse or not improving. Document Released: 12/06/2004 Document Revised: 01/21/2012 Document Reviewed: 12/02/2008 [employer] Patient Information 2014 [employer].  

## 2013-11-25 NOTE — Progress Notes (Signed)
   Subjective:    Patient ID: Jodi Rodriguez, female    DOB: 10-31-1961, 53 y.o.   MRN: [id number]  HPI Pt states sore on R ring finger x 2 months.  It started out as a pin point and has grown to about 1 cm.  Bleeds occasionally.   Tender to touch.  No known injury   Review of Systems As above    Objective:   Physical Exam .BP 138/76  Pulse 82  Temp(Src) 98.3 F (36.8 C) (Oral)  Wt 192 lb (87.091 kg)  SpO2 98% General appearance: alert, cooperative, appears stated age and no distress Extremities: errythematous spot R ring finger dip, tender, no drainage        Assessment & Plan:

## 2013-11-25 NOTE — Assessment & Plan Note (Signed)
abx per orders Refer to derm if no better

## 2013-11-25 NOTE — Progress Notes (Signed)
Pre visit review using our clinic review tool, if applicable. No additional management support is needed unless otherwise documented below in the visit note. 

## 2014-03-10 ENCOUNTER — Ambulatory Visit (INDEPENDENT_AMBULATORY_CARE_PROVIDER_SITE_OTHER): Payer: BC Managed Care – PPO | Admitting: Women's Health

## 2014-03-10 ENCOUNTER — Encounter: Payer: Self-pay | Admitting: Women's Health

## 2014-03-10 ENCOUNTER — Other Ambulatory Visit: Payer: Self-pay

## 2014-03-10 ENCOUNTER — Other Ambulatory Visit (HOSPITAL_COMMUNITY)
Admission: RE | Admit: 2014-03-10 | Discharge: 2014-03-10 | Disposition: A | Discharge status: Home / Self Care | Payer: BC Managed Care – PPO | Source: Ambulatory Visit | Attending: Gynecology | Admitting: Gynecology

## 2014-03-10 VITALS — BP 118/74 | Ht 62.0 in | Wt 172.0 lb

## 2014-03-10 DIAGNOSIS — Z01419 Encounter for gynecological examination (general) (routine) without abnormal findings: Secondary | ICD-10-CM | POA: Insufficient documentation

## 2014-03-10 DIAGNOSIS — Z1231 Encounter for screening mammogram for malignant neoplasm of breast: Secondary | ICD-10-CM

## 2014-03-10 NOTE — Progress Notes (Signed)
Jodi Rodriguez 05/01/1961 [id number]    History:    Presents for annual exam.  Regular monthly cycle since December, had skipped several months this past year. Vasectomy. Fibroid uterus. Hypertension/Hypercholesteremia primary care manages. Normal Pap and mammogram history. Has not had a colonoscopy.  Past medical history, past surgical history, family history and social history were all reviewed and documented in the EPIC chart. 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 Eric playing football at [employer] in December, [place] doing well. Parents hypertension, mother Alzheimer's, father diabetes.  ROS:  A  ROS was performed and pertinent positives and negatives are included.  Exam:  Filed Vitals:   03/10/14 0956  BP: 118/74    General appearance:  Normal Thyroid:  Symmetrical, normal in size, without palpable masses or nodularity. Respiratory  Auscultation:  Clear without wheezing or rhonchi Cardiovascular  Auscultation:  Regular rate, without rubs, murmurs or gallops  Edema/varicosities:  Not grossly evident Abdominal  Soft,nontender, without masses, guarding or rebound.  Liver/spleen:  No organomegaly noted  Hernia:  None appreciated  Skin  Inspection:  Grossly normal   Breasts: Examined lying and sitting.     Right: Without masses, retractions, discharge or axillary adenopathy.     Left: Without masses, retractions, discharge or axillary adenopathy. Gentitourinary   Inguinal/mons:  Normal without inguinal adenopathy  External genitalia:  Normal  BUS/Urethra/Skene's glands:  Normal  Vagina:  Normal  Cervix:  Normal  Uterus:  Fibroid uterus 14 week size .  Midline and mobile  Adnexa/parametria:     Rt: Without masses or tenderness.   Lt: Without masses or tenderness.  Anus and perineum: Normal  Digital rectal exam: Normal sphincter tone without palpated masses or tenderness  Assessment/Plan:  53 y.o. MBF G2P2 for annual exam.    Asymptomatic  fibroid uterus Perimenopausal/vasectomy Hypercholesteremia/hypertension-primary care manages labs and meds  Plan: Screening colonoscopy, will schedule. SBE's, overdue for mammogram - instructed to schedule,calcium rich diet, vitamin D 2000 daily encouraged. Continue healthy diet and exercise has lost 20 pounds in the past year. Pap, Pap normal 2012, new screening guidelines reviewed.   Phelps, 10:26 AM 03/10/2014

## 2014-03-10 NOTE — Addendum Note (Signed)
Addended by: Alen Blew on: 03/10/2014 12:27 PM   Modules accepted: Orders

## 2014-03-10 NOTE — Patient Instructions (Signed)
[facility] Recommendations for Postmenopausal Women Respected and ongoing research has looked at the most common causes of death, disability, and poor quality of life in postmenopausal women. The causes include heart disease, diseases of blood vessels, diabetes, depression, cancer, and bone loss (osteoporosis). Many things can be done to help lower the chances of developing these and other common problems: CARDIOVASCULAR DISEASE Heart Disease: A heart attack is a medical emergency. Know the signs and symptoms of a heart attack. Below are things women can do to reduce their risk for heart disease.   Do not smoke. If you smoke, quit.  Aim for a healthy weight. Being overweight causes many preventable deaths. Eat a healthy and balanced diet and drink an adequate amount of liquids.  Get moving. Make a commitment to be more physically active. Aim for 30 minutes of activity on most, if not all days of the week.  Eat for heart health. Choose a diet that is low in saturated fat and cholesterol and eliminate trans fat. Include whole grains, vegetables, and fruits. Read and understand the labels on food containers before buying.  Know your numbers. Ask your caregiver to check your blood pressure, cholesterol (total, HDL, LDL, triglycerides) and blood glucose. Work with your caregiver on improving your entire clinical picture.  High blood pressure. Limit or stop your table salt intake (try salt substitute and food seasonings). Avoid salty foods and drinks. Read labels on food containers before buying. Eating well and exercising can help control high blood pressure. STROKE  Stroke is a medical emergency. Stroke may be the result of a blood clot in a blood vessel in the brain or by a brain hemorrhage (bleeding). Know the signs and symptoms of a stroke. To lower the risk of developing a stroke:  Avoid fatty foods.  Quit smoking.  Control your diabetes, blood pressure, and irregular heart  rate. THROMBOPHLEBITIS (BLOOD CLOT) OF THE LEG  Becoming overweight and leading a stationary lifestyle may also contribute to developing blood clots. Controlling your diet and exercising will help lower the risk of developing blood clots. CANCER SCREENING  Breast Cancer: Take steps to reduce your risk of breast cancer.  You should practice "breast self-awareness." This means understanding the normal appearance and feel of your breasts and should include breast self-examination. Any changes detected, no matter how small, should be reported to your caregiver.  After age 27, you should have a clinical breast exam (CBE) every year.  Starting at age 26, you should consider having a mammogram (breast X-ray) every year.  If you have a family history of breast cancer, talk to your caregiver about genetic screening.  If you are at high risk for breast cancer, talk to your caregiver about having an MRI and a mammogram every year.  Intestinal or Stomach Cancer: Tests to consider are a rectal exam, fecal occult blood, sigmoidoscopy, and colonoscopy. Women who are high risk may need to be screened at an earlier age and more often.  Cervical Cancer:  Beginning at age 60, you should have a Pap test every 3 years as long as the past 3 Pap tests have been normal.  If you have had past treatment for cervical cancer or a condition that could lead to cancer, you need Pap tests and screening for cancer for at least 20 years after your treatment.  If you had a hysterectomy for a problem that was not cancer or a condition that could lead to cancer, then you no longer need Pap tests.  If you are between ages 31 and 68, and you have had normal Pap tests going back 10 years, you no longer need Pap tests.  If Pap tests have been discontinued, risk factors (such as a new sexual partner) need to be reassessed to determine if screening should be resumed.  Some medical problems can increase the chance of getting  cervical cancer. In these cases, your caregiver may recommend more frequent screening and Pap tests.  Uterine Cancer: If you have vaginal bleeding after reaching menopause, you should notify your caregiver.  Ovarian cancer: Other than yearly pelvic exams, there are no reliable tests available to screen for ovarian cancer at this time except for yearly pelvic exams.  Lung Cancer: Yearly chest X-rays can detect lung cancer and should be done on high risk women, such as cigarette smokers and women with chronic lung disease (emphysema).  Skin Cancer: A complete body skin exam should be done at your yearly examination. Avoid overexposure to the sun and ultraviolet light lamps. Use a strong sun block cream when in the sun. All of these things are important in lowering the risk of skin cancer. MENOPAUSE Menopause Symptoms: Hormone therapy products are effective for treating symptoms associated with menopause:  Moderate to severe hot flashes.  Night sweats.  Mood swings.  Headaches.  Tiredness.  Loss of sex drive.  Insomnia.  Other symptoms. Hormone replacement carries certain risks, especially in older women. Women who use or are thinking about using estrogen or estrogen with progestin treatments should discuss that with their caregiver. Your caregiver will help you understand the benefits and risks. The ideal dose of hormone replacement therapy is not known. The Food and Drug Administration (FDA) has concluded that hormone therapy should be used only at the lowest doses and for the shortest amount of time to reach treatment goals.  OSTEOPOROSIS Protecting Against Bone Loss and Preventing Fracture: If you use hormone therapy for prevention of bone loss (osteoporosis), the risks for bone loss must outweigh the risk of the therapy. Ask your caregiver about other medications known to be safe and effective for preventing bone loss and fractures. To guard against bone loss or fractures, the  following is recommended:  If you are less than age 67, take 1000 mg of calcium and at least 600 mg of Vitamin D per day.  If you are greater than age 48 but less than age 22, take 1200 mg of calcium and at least 600 mg of Vitamin D per day.  If you are greater than age 102, take 1200 mg of calcium and at least 800 mg of Vitamin D per day. Smoking and excessive alcohol intake increases the risk of osteoporosis. Eat foods rich in calcium and vitamin D and do weight bearing exercises several times a week as your caregiver suggests. DIABETES Diabetes Melitus: If you have Type I or Type 2 diabetes, you should keep your blood sugar under control with diet, exercise and recommended medication. Avoid too many sweets, starchy and fatty foods. Being overweight can make control more difficult. COGNITION AND MEMORY Cognition and Memory: Menopausal hormone therapy is not recommended for the prevention of cognitive disorders such as Alzheimer's disease or memory loss.  DEPRESSION  Depression may occur at any age, but is common in elderly women. The reasons may be because of physical, medical, social (loneliness), or financial problems and needs. If you are experiencing depression because of medical problems and control of symptoms, talk to your caregiver about this. Physical activity and  exercise may help with mood and sleep. Community and volunteer involvement may help your sense of value and worth. If you have depression and you feel that the problem is getting worse or becoming severe, talk to your caregiver about treatment options that are best for you. ACCIDENTS  Accidents are common and can be serious in the elderly woman. Prepare your house to prevent accidents. Eliminate throw rugs, place hand bars in the bath, shower and toilet areas. Avoid wearing high heeled shoes or walking on wet, snowy, and icy areas. Limit or stop driving if you have vision or hearing problems, or you feel you are unsteady with you  movements and reflexes. HEPATITIS C Hepatitis C is a type of viral infection affecting the liver. It is spread mainly through contact with blood from an infected person. It can be treated, but if left untreated, it can lead to severe liver damage over years. Many people who are infected do not know that the virus is in their blood. If you are a "baby-boomer", it is recommended that you have one screening test for Hepatitis C. IMMUNIZATIONS  Several immunizations are important to consider having during your senior years, including:   Tetanus, diptheria, and pertussis booster shot.  Influenza every year before the flu season begins.  Pneumonia vaccine.  Shingles vaccine.  Others as indicated based on your specific needs. Talk to your caregiver about these. Document Released: 12/21/2005 Document Revised: 10/15/2012 Document Reviewed: 08/16/2008 [facility] Patient Information 2014 [place].

## 2014-03-31 ENCOUNTER — Ambulatory Visit
Admission: RE | Admit: 2014-03-31 | Discharge: 2014-03-31 | Disposition: A | Discharge status: Home / Self Care | Payer: BC Managed Care – PPO | Source: Ambulatory Visit

## 2014-03-31 DIAGNOSIS — Z1231 Encounter for screening mammogram for malignant neoplasm of breast: Secondary | ICD-10-CM

## 2014-04-02 ENCOUNTER — Encounter: Payer: Self-pay | Admitting: Family Medicine

## 2014-04-02 ENCOUNTER — Ambulatory Visit (INDEPENDENT_AMBULATORY_CARE_PROVIDER_SITE_OTHER): Payer: BC Managed Care – PPO | Admitting: Family Medicine

## 2014-04-02 VITALS — BP 120/80 | HR 51 | Temp 98.0°F | Resp 16 | Wt 168.5 lb

## 2014-04-02 DIAGNOSIS — H6120 Impacted cerumen, unspecified ear: Secondary | ICD-10-CM

## 2014-04-02 NOTE — Progress Notes (Signed)
Pre visit review using our clinic review tool, if applicable. No additional management support is needed unless otherwise documented below in the visit note. 

## 2014-04-02 NOTE — Assessment & Plan Note (Signed)
Recurrent problem for pt.  Able to curette and irrigate successfully.  Pt pleased w/ outcome.  TMs WNL.

## 2014-04-02 NOTE — Progress Notes (Signed)
   Subjective:    Patient ID: Jodi Rodriguez, female    DOB: July 20, 1961, 53 y.o.   MRN: [id number]  HPI Clogged ears- R worse than L, sxs started ~1 week ago.  Hx of similar.  No pain.  No drainage.  No pain   Review of Systems For ROS see HPI     Objective:   Physical Exam  Vitals reviewed. Constitutional: She appears well-developed and well-nourished. No distress.  HENT:  Head: Normocephalic and atraumatic.  EAC full of soft cerumen bilaterally- able to completely irrigate and curette.  TMs WNL bilaterally          Assessment & Plan:

## 2014-04-02 NOTE — Patient Instructions (Signed)
Follow up as needed Continue to use Hydrogen Peroxide in the ears to keep wax soft Call with any questions or concerns Achille Day!!

## 2014-09-13 ENCOUNTER — Encounter: Payer: Self-pay | Admitting: Family Medicine

## 2014-11-29 ENCOUNTER — Other Ambulatory Visit: Payer: Self-pay

## 2014-11-29 DIAGNOSIS — I10 Essential (primary) hypertension: Secondary | ICD-10-CM

## 2014-11-29 MED ORDER — METOPROLOL SUCCINATE ER 200 MG PO TB24: ORAL_TABLET | ORAL | Status: DC

## 2014-12-10 ENCOUNTER — Ambulatory Visit (INDEPENDENT_AMBULATORY_CARE_PROVIDER_SITE_OTHER): Payer: BLUE CROSS/BLUE SHIELD | Admitting: Family Medicine

## 2014-12-10 ENCOUNTER — Encounter: Payer: Self-pay | Admitting: Family Medicine

## 2014-12-10 VITALS — BP 142/90 | HR 68 | Temp 98.2°F | Wt 159.0 lb

## 2014-12-10 DIAGNOSIS — I10 Essential (primary) hypertension: Secondary | ICD-10-CM

## 2014-12-10 DIAGNOSIS — R8290 Unspecified abnormal findings in urine: Secondary | ICD-10-CM

## 2014-12-10 DIAGNOSIS — E785 Hyperlipidemia, unspecified: Secondary | ICD-10-CM

## 2014-12-10 DIAGNOSIS — Z0000 Encounter for general adult medical examination without abnormal findings: Secondary | ICD-10-CM

## 2014-12-10 LAB — CBC WITH DIFFERENTIAL/PLATELET
Basophils Absolute: 0 10*3/uL (ref 0.0–0.1)
Basophils Relative: 0.4 % (ref 0.0–3.0)
Eosinophils Absolute: 0.1 10*3/uL (ref 0.0–0.7)
Eosinophils Relative: 1.8 % (ref 0.0–5.0)
HCT: 39.5 % (ref 36.0–46.0)
HEMOGLOBIN: 13.3 g/dL (ref 12.0–15.0)
LYMPHS ABS: 3 10*3/uL (ref 0.7–4.0)
LYMPHS PCT: 53.5 % — AB (ref 12.0–46.0)
MCHC: 33.6 g/dL (ref 30.0–36.0)
MCV: 85 fl (ref 78.0–100.0)
MONO ABS: 0.3 10*3/uL (ref 0.1–1.0)
Monocytes Relative: 5.2 % (ref 3.0–12.0)
NEUTROS PCT: 39.1 % — AB (ref 43.0–77.0)
Neutro Abs: 2.2 10*3/uL (ref 1.4–7.7)
Platelets: 225 10*3/uL (ref 150.0–400.0)
RBC: 4.64 Mil/uL (ref 3.87–5.11)
RDW: 12.9 % (ref 11.5–15.5)
WBC: 5.7 10*3/uL (ref 4.0–10.5)

## 2014-12-10 LAB — POCT URINALYSIS DIPSTICK
Bilirubin, UA: NEGATIVE
Blood, UA: NEGATIVE
GLUCOSE UA: NEGATIVE
LEUKOCYTES UA: NEGATIVE
Nitrite, UA: NEGATIVE
PH UA: 5
UROBILINOGEN UA: NEGATIVE

## 2014-12-10 LAB — BASIC METABOLIC PANEL
BUN: 18 mg/dL (ref 6–23)
CALCIUM: 9.8 mg/dL (ref 8.4–10.5)
CO2: 28 meq/L (ref 19–32)
Chloride: 105 mEq/L (ref 96–112)
Creatinine, Ser: 0.84 mg/dL (ref 0.40–1.20)
GFR: 91.81 mL/min (ref >60.00)
Glucose, Bld: 87 mg/dL (ref 70–99)
Potassium: 3.9 mEq/L (ref 3.5–5.1)
Sodium: 140 mEq/L (ref 135–145)

## 2014-12-10 LAB — HEPATIC FUNCTION PANEL
ALBUMIN: 4.5 g/dL (ref 3.5–5.2)
ALK PHOS: 54 U/L (ref 39–117)
ALT: 13 U/L (ref 0–35)
AST: 18 U/L (ref 0–37)
BILIRUBIN TOTAL: 0.5 mg/dL (ref 0.2–1.2)
Bilirubin, Direct: 0.1 mg/dL (ref 0.0–0.3)
TOTAL PROTEIN: 7.8 g/dL (ref 6.0–8.3)

## 2014-12-10 LAB — LIPID PANEL
CHOLESTEROL: 226 mg/dL — AB (ref 0–200)
HDL: 74.5 mg/dL (ref >39.00)
LDL CALC: 141 mg/dL — AB (ref 0–99)
NonHDL: 151.5
TRIGLYCERIDES: 52 mg/dL (ref 0.0–149.0)
Total CHOL/HDL Ratio: 3
VLDL: 10.4 mg/dL (ref 0.0–40.0)

## 2014-12-10 LAB — TSH: TSH: 1.28 u[IU]/mL (ref 0.35–4.50)

## 2014-12-10 MED ORDER — METOPROLOL SUCCINATE ER 200 MG PO TB24: ORAL_TABLET | ORAL | Status: DC

## 2014-12-10 NOTE — Addendum Note (Signed)
Addended by: Peggyann Shoals on: 12/10/2014 04:52 PM   Modules accepted: Orders

## 2014-12-10 NOTE — Progress Notes (Signed)
Subjective:     Jodi Rodriguez is a 54 y.o. female and is here for a comprehensive physical exam. The patient reports no problems.  History   Social History  . Marital Status: Married    Spouse Name: N/A    Number of Children: N/A  . Years of Education: N/A   Occupational History  . Insurance    Social History Main Topics  . Smoking status: Never Smoker   . Smokeless tobacco: Never Used  . Alcohol Use: 0.0 oz/week    0 Not specified per week     Comment: occass one weekly  . Drug Use: No  . Sexual Activity:    Partners: Male    Birth Control/ Protection: Other-see comments     Comment: vasectomy   Other Topics Concern  . Not on file   Social History Narrative   Exercise-- 3x a week -- walk 1 mile on treadmill or stationary bike   Health Maintenance  Topic Date Due  . COLONOSCOPY  08/10/2013  . INFLUENZA VACCINE  12/11/2015 (Originally 06/12/2014)  . MAMMOGRAM  04/01/2015  . PAP SMEAR  03/10/2017  . TETANUS/TDAP  05/24/2018    The following portions of the patient's history were reviewed and updated as appropriate:  She  has a past medical history of Hyperlipidemia; Anxiety; Hypertension; and Fibroid. She  does not have any pertinent problems on file. She  has past surgical history that includes Breast biopsy (1981). Her family history includes Alzheimer's disease in her mother and another family member; Diabetes in her father; Hypertension in her father, mother, and sister; Thyroid disease in her sister. She  reports that she has never smoked. She has never used smokeless tobacco. She reports that she drinks alcohol. She reports that she does not use illicit drugs. She has a current medication list which includes the following prescription(s): ascorbic acid, aspirin, metoprolol, and multivitamin. Current Outpatient Prescriptions on File Prior to Visit  Medication Sig Dispense Refill  . Ascorbic Acid (VITAMIN C PO) Take by mouth daily.      Marland Kitchen aspirin 81 MG  chewable tablet Chew 81 mg by mouth daily.      . Multiple Vitamin (MULTIVITAMIN) tablet Take 1 tablet by mouth daily.       No current facility-administered medications on file prior to visit.   She has No Known Allergies..  Review of Systems Review of Systems  Constitutional: Negative for activity change, appetite change and fatigue.  HENT: Negative for hearing loss, congestion, tinnitus and ear discharge.  dentist q26m Eyes: Negative for visual disturbance (see optho q2y -- vision corrected to 20/20 with glasses).  Respiratory: Negative for cough, chest tightness and shortness of breath.   Cardiovascular: Negative for chest pain, palpitations and leg swelling.  Gastrointestinal: Negative for abdominal pain, diarrhea, constipation and abdominal distention.  Genitourinary: Negative for urgency, frequency, decreased urine volume and difficulty urinating.  Musculoskeletal: Negative for back pain, arthralgias and gait problem.  Skin: Negative for color change, pallor and rash.  Neurological: Negative for dizziness, light-headedness, numbness and headaches.  Hematological: Negative for adenopathy. Does not bruise/bleed easily.  Psychiatric/Behavioral: Negative for suicidal ideas, confusion, sleep disturbance, self-injury, dysphoric mood, decreased concentration and agitation.       Objective:    BP 142/90 mmHg  Pulse 68  Temp(Src) 98.2 F (36.8 C) (Oral)  Wt 159 lb (72.122 kg)  SpO2 96%  LMP  General appearance: alert, cooperative, appears stated age and no distress Head: Normocephalic, without obvious abnormality,  atraumatic Eyes: negative findings: lids and lashes normal and pupils equal, round, reactive to light and accomodation Ears: normal TM's and external ear canals both ears Nose: Nares normal. Septum midline. Mucosa normal. No drainage or sinus tenderness. Throat: lips, mucosa, and tongue normal; teeth and gums normal Neck: no adenopathy, no carotid bruit, no JVD,  supple, symmetrical, trachea midline and thyroid not enlarged, symmetric, no tenderness/mass/nodules Back: symmetric, no curvature. ROM normal. No CVA tenderness. Lungs: clear to auscultation bilaterally Breasts: gyn Heart: regular rate and rhythm, S1, S2 normal, no murmur, click, rub or gallop Abdomen: soft, non-tender; bowel sounds normal; no masses,  no organomegaly Pelvic: deferred--gyn Extremities: extremities normal, atraumatic, no cyanosis or edema Pulses: 2+ and symmetric Skin: Skin color, texture, turgor normal. No rashes or lesions Lymph nodes: Cervical, supraclavicular, and axillary nodes normal. Neurologic: Alert and oriented X 3, normal strength and tone. Normal symmetric reflexes. Normal coordination and gait Psych- no depression, no anxiety      ekg-- nsr  Assessment:    Healthy female exam.      Plan:    ghm utd Check labs See After Visit Summary for Counseling Recommendations    1. Essential hypertension Pt was out of meds for 2 weeks--- d/w importance of taking meds and not running out Pt understands - metoprolol (TOPROL-XL) 200 MG 24 hr tablet; TAKE 1 TABLET DAILY  Dispense: 90 tablet; Refill: 1 - Basic metabolic panel - CBC with Differential/Platelet - Hepatic function panel - Lipid panel - POCT urinalysis dipstick - TSH - EKG 12-Lead  2. Preventative health care   - Ambulatory referral to Gastroenterology - Basic metabolic panel - CBC with Differential/Platelet - Hepatic function panel - Lipid panel - POCT urinalysis dipstick - TSH  3. Hyperlipidemia Check labs - EKG 12-Lead

## 2014-12-10 NOTE — Patient Instructions (Signed)
Preventive Care for Adults A healthy lifestyle and preventive care can promote health and wellness. Preventive health guidelines for women include the following key practices.  A routine yearly physical is a good way to check with your health care provider about your health and preventive screening. It is a chance to share any concerns and updates on your health and to receive a thorough exam.  Visit your dentist for a routine exam and preventive care every 6 months. Brush your teeth twice a day and floss once a day. Good oral hygiene prevents tooth decay and gum disease.  The frequency of eye exams is based on your age, health, family medical history, use of contact lenses, and other factors. Follow your health care provider's recommendations for frequency of eye exams.  Eat a healthy diet. Foods like vegetables, fruits, whole grains, low-fat dairy products, and lean protein foods contain the nutrients you need without too many calories. Decrease your intake of foods high in solid fats, added sugars, and salt. Eat the right amount of calories for you.Get information about a proper diet from your health care provider, if necessary.  Regular physical exercise is one of the most important things you can do for your health. Most adults should get at least 150 minutes of moderate-intensity exercise (any activity that increases your heart rate and causes you to sweat) each week. In addition, most adults need muscle-strengthening exercises on 2 or more days a week.  Maintain a healthy weight. The body mass index (BMI) is a screening tool to identify possible weight problems. It provides an estimate of body fat based on height and weight. Your health care provider can find your BMI and can help you achieve or maintain a healthy weight.For adults 20 years and older:  A BMI below 18.5 is considered underweight.  A BMI of 18.5 to 24.9 is normal.  A BMI of 25 to 29.9 is considered overweight.  A BMI of  30 and above is considered obese.  Maintain normal blood lipids and cholesterol levels by exercising and minimizing your intake of saturated fat. Eat a balanced diet with plenty of fruit and vegetables. Blood tests for lipids and cholesterol should begin at age 76 and be repeated every 5 years. If your lipid or cholesterol levels are high, you are over 50, or you are at high risk for heart disease, you may need your cholesterol levels checked more frequently.Ongoing high lipid and cholesterol levels should be treated with medicines if diet and exercise are not working.  If you smoke, find out from your health care provider how to quit. If you do not use tobacco, do not start.  Lung cancer screening is recommended for adults aged 22-80 years who are at high risk for developing lung cancer because of a history of smoking. A yearly low-dose CT scan of the lungs is recommended for people who have at least a 30-pack-year history of smoking and are a current smoker or have quit within the past 15 years. A pack year of smoking is smoking an average of 1 pack of cigarettes a day for 1 year (for example: 1 pack a day for 30 years or 2 packs a day for 15 years). Yearly screening should continue until the smoker has stopped smoking for at least 15 years. Yearly screening should be stopped for people who develop a health problem that would prevent them from having lung cancer treatment.  If you are pregnant, do not drink alcohol. If you are breastfeeding,  be very cautious about drinking alcohol. If you are not pregnant and choose to drink alcohol, do not have more than 1 drink per day. One drink is considered to be 12 ounces (355 mL) of beer, 5 ounces (148 mL) of wine, or 1.5 ounces (44 mL) of liquor.  Avoid use of street drugs. Do not share needles with anyone. Ask for help if you need support or instructions about stopping the use of drugs.  High blood pressure causes heart disease and increases the risk of  stroke. Your blood pressure should be checked at least every 1 to 2 years. Ongoing high blood pressure should be treated with medicines if weight loss and exercise do not work.  If you are 75-52 years old, ask your health care provider if you should take aspirin to prevent strokes.  Diabetes screening involves taking a blood sample to check your fasting blood sugar level. This should be done once every 3 years, after age 15, if you are within normal weight and without risk factors for diabetes. Testing should be considered at a younger age or be carried out more frequently if you are overweight and have at least 1 risk factor for diabetes.  Breast cancer screening is essential preventive care for women. You should practice "breast self-awareness." This means understanding the normal appearance and feel of your breasts and may include breast self-examination. Any changes detected, no matter how small, should be reported to a health care provider. Women in their 58s and 30s should have a clinical breast exam (CBE) by a health care provider as part of a regular health exam every 1 to 3 years. After age 16, women should have a CBE every year. Starting at age 53, women should consider having a mammogram (breast X-ray test) every year. Women who have a family history of breast cancer should talk to their health care provider about genetic screening. Women at a high risk of breast cancer should talk to their health care providers about having an MRI and a mammogram every year.  Breast cancer gene (BRCA)-related cancer risk assessment is recommended for women who have family members with BRCA-related cancers. BRCA-related cancers include breast, ovarian, tubal, and peritoneal cancers. Having family members with these cancers may be associated with an increased risk for harmful changes (mutations) in the breast cancer genes BRCA1 and BRCA2. Results of the assessment will determine the need for genetic counseling and  BRCA1 and BRCA2 testing.  Routine pelvic exams to screen for cancer are no longer recommended for nonpregnant women who are considered low risk for cancer of the pelvic organs (ovaries, uterus, and vagina) and who do not have symptoms. Ask your health care provider if a screening pelvic exam is right for you.  If you have had past treatment for cervical cancer or a condition that could lead to cancer, you need Pap tests and screening for cancer for at least 20 years after your treatment. If Pap tests have been discontinued, your risk factors (such as having a new sexual partner) need to be reassessed to determine if screening should be resumed. Some women have medical problems that increase the chance of getting cervical cancer. In these cases, your health care provider may recommend more frequent screening and Pap tests.  The HPV test is an additional test that may be used for cervical cancer screening. The HPV test looks for the virus that can cause the cell changes on the cervix. The cells collected during the Pap test can be  tested for HPV. The HPV test could be used to screen women aged 30 years and older, and should be used in women of any age who have unclear Pap test results. After the age of 30, women should have HPV testing at the same frequency as a Pap test.  Colorectal cancer can be detected and often prevented. Most routine colorectal cancer screening begins at the age of 50 years and continues through age 75 years. However, your health care provider may recommend screening at an earlier age if you have risk factors for colon cancer. On a yearly basis, your health care provider may provide home test kits to check for hidden blood in the stool. Use of a small camera at the end of a tube, to directly examine the colon (sigmoidoscopy or colonoscopy), can detect the earliest forms of colorectal cancer. Talk to your health care provider about this at age 50, when routine screening begins. Direct  exam of the colon should be repeated every 5-10 years through age 75 years, unless early forms of pre-cancerous polyps or small growths are found.  People who are at an increased risk for hepatitis B should be screened for this virus. You are considered at high risk for hepatitis B if:  You were born in a country where hepatitis B occurs often. Talk with your health care provider about which countries are considered high risk.  Your parents were born in a high-risk country and you have not received a shot to protect against hepatitis B (hepatitis B vaccine).  You have HIV or AIDS.  You use needles to inject street drugs.  You live with, or have sex with, someone who has hepatitis B.  You get hemodialysis treatment.  You take certain medicines for conditions like cancer, organ transplantation, and autoimmune conditions.  Hepatitis C blood testing is recommended for all people born from 1945 through 1965 and any individual with known risks for hepatitis C.  Practice safe sex. Use condoms and avoid high-risk sexual practices to reduce the spread of sexually transmitted infections (STIs). STIs include gonorrhea, chlamydia, syphilis, trichomonas, herpes, HPV, and human immunodeficiency virus (HIV). Herpes, HIV, and HPV are viral illnesses that have no cure. They can result in disability, cancer, and death.  You should be screened for sexually transmitted illnesses (STIs) including gonorrhea and chlamydia if:  You are sexually active and are younger than 24 years.  You are older than 24 years and your health care provider tells you that you are at risk for this type of infection.  Your sexual activity has changed since you were last screened and you are at an increased risk for chlamydia or gonorrhea. Ask your health care provider if you are at risk.  If you are at risk of being infected with HIV, it is recommended that you take a prescription medicine daily to prevent HIV infection. This is  called preexposure prophylaxis (PrEP). You are considered at risk if:  You are a heterosexual woman, are sexually active, and are at increased risk for HIV infection.  You take drugs by injection.  You are sexually active with a partner who has HIV.  Talk with your health care provider about whether you are at high risk of being infected with HIV. If you choose to begin PrEP, you should first be tested for HIV. You should then be tested every 3 months for as long as you are taking PrEP.  Osteoporosis is a disease in which the bones lose minerals and strength   with aging. This can result in serious bone fractures or breaks. The risk of osteoporosis can be identified using a bone density scan. Women ages 65 years and over and women at risk for fractures or osteoporosis should discuss screening with their health care providers. Ask your health care provider whether you should take a calcium supplement or vitamin D to reduce the rate of osteoporosis.  Menopause can be associated with physical symptoms and risks. Hormone replacement therapy is available to decrease symptoms and risks. You should talk to your health care provider about whether hormone replacement therapy is right for you.  Use sunscreen. Apply sunscreen liberally and repeatedly throughout the day. You should seek shade when your shadow is shorter than you. Protect yourself by wearing long sleeves, pants, a wide-brimmed hat, and sunglasses year round, whenever you are outdoors.  Once a month, do a whole body skin exam, using a mirror to look at the skin on your back. Tell your health care provider of new moles, moles that have irregular borders, moles that are larger than a pencil eraser, or moles that have changed in shape or color.  Stay current with required vaccines (immunizations).  Influenza vaccine. All adults should be immunized every year.  Tetanus, diphtheria, and acellular pertussis (Td, Tdap) vaccine. Pregnant women should  receive 1 dose of Tdap vaccine during each pregnancy. The dose should be obtained regardless of the length of time since the last dose. Immunization is preferred during the 27th-36th week of gestation. An adult who has not previously received Tdap or who does not know her vaccine status should receive 1 dose of Tdap. This initial dose should be followed by tetanus and diphtheria toxoids (Td) booster doses every 10 years. Adults with an unknown or incomplete history of completing a 3-dose immunization series with Td-containing vaccines should begin or complete a primary immunization series including a Tdap dose. Adults should receive a Td booster every 10 years.  Varicella vaccine. An adult without evidence of immunity to varicella should receive 2 doses or a second dose if she has previously received 1 dose. Pregnant females who do not have evidence of immunity should receive the first dose after pregnancy. This first dose should be obtained before leaving the health care facility. The second dose should be obtained 4-8 weeks after the first dose.  Human papillomavirus (HPV) vaccine. Females aged 13-26 years who have not received the vaccine previously should obtain the 3-dose series. The vaccine is not recommended for use in pregnant females. However, pregnancy testing is not needed before receiving a dose. If a female is found to be pregnant after receiving a dose, no treatment is needed. In that case, the remaining doses should be delayed until after the pregnancy. Immunization is recommended for any person with an immunocompromised condition through the age of 26 years if she did not get any or all doses earlier. During the 3-dose series, the second dose should be obtained 4-8 weeks after the first dose. The third dose should be obtained 24 weeks after the first dose and 16 weeks after the second dose.  Zoster vaccine. One dose is recommended for adults aged 60 years or older unless certain conditions are  present.  Measles, mumps, and rubella (MMR) vaccine. Adults born before 1957 generally are considered immune to measles and mumps. Adults born in 1957 or later should have 1 or more doses of MMR vaccine unless there is a contraindication to the vaccine or there is laboratory evidence of immunity to   each of the three diseases. A routine second dose of MMR vaccine should be obtained at least 28 days after the first dose for students attending postsecondary schools, health care workers, or international travelers. People who received inactivated measles vaccine or an unknown type of measles vaccine during 1963-1967 should receive 2 doses of MMR vaccine. People who received inactivated mumps vaccine or an unknown type of mumps vaccine before 1979 and are at high risk for mumps infection should consider immunization with 2 doses of MMR vaccine. For females of childbearing age, rubella immunity should be determined. If there is no evidence of immunity, females who are not pregnant should be vaccinated. If there is no evidence of immunity, females who are pregnant should delay immunization until after pregnancy. Unvaccinated health care workers born before 1957 who lack laboratory evidence of measles, mumps, or rubella immunity or laboratory confirmation of disease should consider measles and mumps immunization with 2 doses of MMR vaccine or rubella immunization with 1 dose of MMR vaccine.  Pneumococcal 13-valent conjugate (PCV13) vaccine. When indicated, a person who is uncertain of her immunization history and has no record of immunization should receive the PCV13 vaccine. An adult aged 19 years or older who has certain medical conditions and has not been previously immunized should receive 1 dose of PCV13 vaccine. This PCV13 should be followed with a dose of pneumococcal polysaccharide (PPSV23) vaccine. The PPSV23 vaccine dose should be obtained at least 8 weeks after the dose of PCV13 vaccine. An adult aged 19  years or older who has certain medical conditions and previously received 1 or more doses of PPSV23 vaccine should receive 1 dose of PCV13. The PCV13 vaccine dose should be obtained 1 or more years after the last PPSV23 vaccine dose.  Pneumococcal polysaccharide (PPSV23) vaccine. When PCV13 is also indicated, PCV13 should be obtained first. All adults aged 65 years and older should be immunized. An adult younger than age 65 years who has certain medical conditions should be immunized. Any person who resides in a nursing home or long-term care facility should be immunized. An adult smoker should be immunized. People with an immunocompromised condition and certain other conditions should receive both PCV13 and PPSV23 vaccines. People with human immunodeficiency virus (HIV) infection should be immunized as soon as possible after diagnosis. Immunization during chemotherapy or radiation therapy should be avoided. Routine use of PPSV23 vaccine is not recommended for American Indians, [place] Natives, or people younger than 65 years unless there are medical conditions that require PPSV23 vaccine. When indicated, people who have unknown immunization and have no record of immunization should receive PPSV23 vaccine. One-time revaccination 5 years after the first dose of PPSV23 is recommended for people aged 19-64 years who have chronic kidney failure, nephrotic syndrome, asplenia, or immunocompromised conditions. People who received 1-2 doses of PPSV23 before age 65 years should receive another dose of PPSV23 vaccine at age 65 years or later if at least 5 years have passed since the previous dose. Doses of PPSV23 are not needed for people immunized with PPSV23 at or after age 65 years.  Meningococcal vaccine. Adults with asplenia or persistent complement component deficiencies should receive 2 doses of quadrivalent meningococcal conjugate (MenACWY-D) vaccine. The doses should be obtained at least 2 months apart.  Microbiologists working with certain meningococcal bacteria, military recruits, people at risk during an outbreak, and people who travel to or live in countries with a high rate of meningitis should be immunized. A first-year college student up through age   21 years who is living in a residence hall should receive a dose if she did not receive a dose on or after her 16th birthday. Adults who have certain high-risk conditions should receive one or more doses of vaccine.  Hepatitis A vaccine. Adults who wish to be protected from this disease, have certain high-risk conditions, work with hepatitis A-infected animals, work in hepatitis A research labs, or travel to or work in countries with a high rate of hepatitis A should be immunized. Adults who were previously unvaccinated and who anticipate close contact with an international adoptee during the first 60 days after arrival in the [place] from a country with a high rate of hepatitis A should be immunized.  Hepatitis B vaccine. Adults who wish to be protected from this disease, have certain high-risk conditions, may be exposed to blood or other infectious body fluids, are household contacts or sex partners of hepatitis B positive people, are clients or workers in certain care facilities, or travel to or work in countries with a high rate of hepatitis B should be immunized.  Haemophilus influenzae type b (Hib) vaccine. A previously unvaccinated person with asplenia or sickle cell disease or having a scheduled splenectomy should receive 1 dose of Hib vaccine. Regardless of previous immunization, a recipient of a hematopoietic stem cell transplant should receive a 3-dose series 6-12 months after her successful transplant. Hib vaccine is not recommended for adults with HIV infection. Preventive Services / Frequency Ages 64 to 68 years  Blood pressure check.** / Every 1 to 2 years.  Lipid and cholesterol check.** / Every 5 years beginning at age  22.  Clinical breast exam.** / Every 3 years for women in their 88s and 53s.  BRCA-related cancer risk assessment.** / For women who have family members with a BRCA-related cancer (breast, ovarian, tubal, or peritoneal cancers).  Pap test.** / Every 2 years from ages 90 through 51. Every 3 years starting at age 21 through age 56 or 3 with a history of 3 consecutive normal Pap tests.  HPV screening.** / Every 3 years from ages 24 through ages 1 to 46 with a history of 3 consecutive normal Pap tests.  Hepatitis C blood test.** / For any individual with known risks for hepatitis C.  Skin self-exam. / Monthly.  Influenza vaccine. / Every year.  Tetanus, diphtheria, and acellular pertussis (Tdap, Td) vaccine.** / Consult your health care provider. Pregnant women should receive 1 dose of Tdap vaccine during each pregnancy. 1 dose of Td every 10 years.  Varicella vaccine.** / Consult your health care provider. Pregnant females who do not have evidence of immunity should receive the first dose after pregnancy.  HPV vaccine. / 3 doses over 6 months, if 72 and younger. The vaccine is not recommended for use in pregnant females. However, pregnancy testing is not needed before receiving a dose.  Measles, mumps, rubella (MMR) vaccine.** / You need at least 1 dose of MMR if you were born in 1957 or later. You may also need a 2nd dose. For females of childbearing age, rubella immunity should be determined. If there is no evidence of immunity, females who are not pregnant should be vaccinated. If there is no evidence of immunity, females who are pregnant should delay immunization until after pregnancy.  Pneumococcal 13-valent conjugate (PCV13) vaccine.** / Consult your health care provider.  Pneumococcal polysaccharide (PPSV23) vaccine.** / 1 to 2 doses if you smoke cigarettes or if you have certain conditions.  Meningococcal vaccine.** /  1 dose if you are age 19 to 21 years and a first-year college  student living in a residence hall, or have one of several medical conditions, you need to get vaccinated against meningococcal disease. You may also need additional booster doses.  Hepatitis A vaccine.** / Consult your health care provider.  Hepatitis B vaccine.** / Consult your health care provider.  Haemophilus influenzae type b (Hib) vaccine.** / Consult your health care provider. Ages 40 to 64 years  Blood pressure check.** / Every 1 to 2 years.  Lipid and cholesterol check.** / Every 5 years beginning at age 20 years.  Lung cancer screening. / Every year if you are aged 55-80 years and have a 30-pack-year history of smoking and currently smoke or have quit within the past 15 years. Yearly screening is stopped once you have quit smoking for at least 15 years or develop a health problem that would prevent you from having lung cancer treatment.  Clinical breast exam.** / Every year after age 40 years.  BRCA-related cancer risk assessment.** / For women who have family members with a BRCA-related cancer (breast, ovarian, tubal, or peritoneal cancers).  Mammogram.** / Every year beginning at age 40 years and continuing for as long as you are in good health. Consult with your health care provider.  Pap test.** / Every 3 years starting at age 30 years through age 65 or 70 years with a history of 3 consecutive normal Pap tests.  HPV screening.** / Every 3 years from ages 30 years through ages 65 to 70 years with a history of 3 consecutive normal Pap tests.  Fecal occult blood test (FOBT) of stool. / Every year beginning at age 50 years and continuing until age 75 years. You may not need to do this test if you get a colonoscopy every 10 years.  Flexible sigmoidoscopy or colonoscopy.** / Every 5 years for a flexible sigmoidoscopy or every 10 years for a colonoscopy beginning at age 50 years and continuing until age 75 years.  Hepatitis C blood test.** / For all people born from 1945 through  1965 and any individual with known risks for hepatitis C.  Skin self-exam. / Monthly.  Influenza vaccine. / Every year.  Tetanus, diphtheria, and acellular pertussis (Tdap/Td) vaccine.** / Consult your health care provider. Pregnant women should receive 1 dose of Tdap vaccine during each pregnancy. 1 dose of Td every 10 years.  Varicella vaccine.** / Consult your health care provider. Pregnant females who do not have evidence of immunity should receive the first dose after pregnancy.  Zoster vaccine.** / 1 dose for adults aged 60 years or older.  Measles, mumps, rubella (MMR) vaccine.** / You need at least 1 dose of MMR if you were born in 1957 or later. You may also need a 2nd dose. For females of childbearing age, rubella immunity should be determined. If there is no evidence of immunity, females who are not pregnant should be vaccinated. If there is no evidence of immunity, females who are pregnant should delay immunization until after pregnancy.  Pneumococcal 13-valent conjugate (PCV13) vaccine.** / Consult your health care provider.  Pneumococcal polysaccharide (PPSV23) vaccine.** / 1 to 2 doses if you smoke cigarettes or if you have certain conditions.  Meningococcal vaccine.** / Consult your health care provider.  Hepatitis A vaccine.** / Consult your health care provider.  Hepatitis B vaccine.** / Consult your health care provider.  Haemophilus influenzae type b (Hib) vaccine.** / Consult your health care provider. Ages 65   years and over  Blood pressure check.** / Every 1 to 2 years.  Lipid and cholesterol check.** / Every 5 years beginning at age 22 years.  Lung cancer screening. / Every year if you are aged 73-80 years and have a 30-pack-year history of smoking and currently smoke or have quit within the past 15 years. Yearly screening is stopped once you have quit smoking for at least 15 years or develop a health problem that would prevent you from having lung cancer  treatment.  Clinical breast exam.** / Every year after age 4 years.  BRCA-related cancer risk assessment.** / For women who have family members with a BRCA-related cancer (breast, ovarian, tubal, or peritoneal cancers).  Mammogram.** / Every year beginning at age 40 years and continuing for as long as you are in good health. Consult with your health care provider.  Pap test.** / Every 3 years starting at age 9 years through age 34 or 91 years with 3 consecutive normal Pap tests. Testing can be stopped between 65 and 70 years with 3 consecutive normal Pap tests and no abnormal Pap or HPV tests in the past 10 years.  HPV screening.** / Every 3 years from ages 57 years through ages 64 or 45 years with a history of 3 consecutive normal Pap tests. Testing can be stopped between 65 and 70 years with 3 consecutive normal Pap tests and no abnormal Pap or HPV tests in the past 10 years.  Fecal occult blood test (FOBT) of stool. / Every year beginning at age 15 years and continuing until age 17 years. You may not need to do this test if you get a colonoscopy every 10 years.  Flexible sigmoidoscopy or colonoscopy.** / Every 5 years for a flexible sigmoidoscopy or every 10 years for a colonoscopy beginning at age 86 years and continuing until age 71 years.  Hepatitis C blood test.** / For all people born from [birth year] through 1963 and any individual with known risks for hepatitis C.  Osteoporosis screening.** / A one-time screening for women ages 83 years and over and women at risk for fractures or osteoporosis.  Skin self-exam. / Monthly.  Influenza vaccine. / Every year.  Tetanus, diphtheria, and acellular pertussis (Tdap/Td) vaccine.** / 1 dose of Td every 10 years.  Varicella vaccine.** / Consult your health care provider.  Zoster vaccine.** / 1 dose for adults aged 61 years or older.  Pneumococcal 13-valent conjugate (PCV13) vaccine.** / Consult your health care provider.  Pneumococcal  polysaccharide (PPSV23) vaccine.** / 1 dose for all adults aged 28 years and older.  Meningococcal vaccine.** / Consult your health care provider.  Hepatitis A vaccine.** / Consult your health care provider.  Hepatitis B vaccine.** / Consult your health care provider.  Haemophilus influenzae type b (Hib) vaccine.** / Consult your health care provider. ** Family history and personal history of risk and conditions may change your health care provider's recommendations. Document Released: 12/25/2001 Document Revised: 03/15/2014 Document Reviewed: 03/26/2011 [facility] Patient Information 2015 [place], [place]. This information is not intended to replace advice given to you by your health care provider. Make sure you discuss any questions you have with your health care provider.

## 2014-12-10 NOTE — Progress Notes (Signed)
Pre visit review using our clinic review tool, if applicable. No additional management support is needed unless otherwise documented below in the visit note. 

## 2014-12-12 LAB — URINE CULTURE

## 2015-03-15 ENCOUNTER — Encounter: Payer: BC Managed Care – PPO | Admitting: Women's Health

## 2015-05-21 ENCOUNTER — Other Ambulatory Visit: Payer: Self-pay | Admitting: Family Medicine

## 2015-08-19 ENCOUNTER — Other Ambulatory Visit: Payer: Self-pay | Admitting: Family Medicine

## 2015-08-19 NOTE — Telephone Encounter (Signed)
90 day supply metoprolol sent to [facility]. Pt is past due for follow up.  Please call pt to schedule f/u with Dr Etter Sjogren before further refills are due.  Thanks!

## 2015-08-23 NOTE — Telephone Encounter (Signed)
Patient scheduled for 09/01/15 (Thu) t 2:15pm

## 2015-09-01 ENCOUNTER — Encounter: Payer: Self-pay | Admitting: Family Medicine

## 2015-09-01 ENCOUNTER — Ambulatory Visit (INDEPENDENT_AMBULATORY_CARE_PROVIDER_SITE_OTHER): Payer: BLUE CROSS/BLUE SHIELD | Admitting: Family Medicine

## 2015-09-01 ENCOUNTER — Encounter (INDEPENDENT_AMBULATORY_CARE_PROVIDER_SITE_OTHER): Payer: Self-pay

## 2015-09-01 VITALS — BP 166/80 | HR 65 | Temp 98.5°F | Ht 62.0 in | Wt 169.8 lb

## 2015-09-01 DIAGNOSIS — I10 Essential (primary) hypertension: Secondary | ICD-10-CM

## 2015-09-01 MED ORDER — AMLODIPINE BESYLATE 5 MG PO TABS: 5.0000 mg | ORAL_TABLET | Freq: Every day | ORAL | Status: DC

## 2015-09-01 MED ORDER — METOPROLOL SUCCINATE ER 200 MG PO TB24: ORAL_TABLET | ORAL | Status: DC

## 2015-09-01 NOTE — Progress Notes (Signed)
Patient ID: SKYA MCCULLUM, female    DOB: [birth year]  Age: 54 y.o. MRN: [id number]    Subjective:  Subjective HPI KETZIA GUZEK presents for f/u bp,  No complaints except she is frustrated she can not lose weight.  She gained 10 lbs this summer.    Review of Systems  Constitutional: Negative for diaphoresis, appetite change, fatigue and unexpected weight change.  Eyes: Negative for pain, redness and visual disturbance.  Respiratory: Negative for cough, chest tightness, shortness of breath and wheezing.   Cardiovascular: Negative for chest pain, palpitations and leg swelling.  Endocrine: Negative for cold intolerance, heat intolerance, polydipsia, polyphagia and polyuria.  Genitourinary: Negative for dysuria, frequency and difficulty urinating.  Neurological: Negative for dizziness, light-headedness, numbness and headaches.  All other systems reviewed and are negative.   History Past Medical History  Diagnosis Date  . Hyperlipidemia   . Anxiety   . Hypertension   . Fibroid     16 WK SIZE UT    She has past surgical history that includes Breast biopsy (1981).   Her family history includes Alzheimer's disease in her mother and another family member; Diabetes in her father; Hypertension in her father, mother, and sister; Thyroid disease in her sister.She reports that she has never smoked. She has never used smokeless tobacco. She reports that she drinks alcohol. She reports that she does not use illicit drugs.  Current Outpatient Prescriptions on File Prior to Visit  Medication Sig Dispense Refill  . Ascorbic Acid (VITAMIN C PO) Take by mouth daily.      Marland Kitchen aspirin 81 MG chewable tablet Chew 81 mg by mouth daily.      . Multiple Vitamin (MULTIVITAMIN) tablet Take 1 tablet by mouth daily.       No current facility-administered medications on file prior to visit.     Objective:  Objective Physical Exam  Constitutional: She is oriented to person, place, and time. She  appears well-developed and well-nourished.  HENT:  Head: Normocephalic and atraumatic.  Eyes: Conjunctivae and EOM are normal.  Neck: Normal range of motion. Neck supple. No JVD present. Carotid bruit is not present. No thyromegaly present.  Cardiovascular: Normal rate, regular rhythm and normal heart sounds.   No murmur heard. Pulmonary/Chest: Effort normal and breath sounds normal. No respiratory distress. She has no wheezes. She has no rales. She exhibits no tenderness.  Musculoskeletal: She exhibits no edema.  Neurological: She is alert and oriented to person, place, and time.  Psychiatric: She has a normal mood and affect. Her behavior is normal.  Nursing note and vitals reviewed.  BP 166/80 mmHg  Pulse 65  Temp(Src) 98.5 F (36.9 C) (Oral)  Ht 5\' 2"  (1.575 m)  Wt 169 lb 12.8 oz (77.021 kg)  BMI 31.05 kg/m2  SpO2 98% Wt Readings from Last 3 Encounters:  09/01/15 169 lb 12.8 oz (77.021 kg)  12/10/14 159 lb (72.122 kg)  04/02/14 168 lb 8 oz (76.431 kg)     Lab Results  Component Value Date   WBC 5.7 12/10/2014   HGB 13.3 12/10/2014   HCT 39.5 12/10/2014   PLT 225.0 12/10/2014   GLUCOSE 87 12/10/2014   CHOL 226* 12/10/2014   TRIG 52.0 12/10/2014   HDL 74.50 12/10/2014   LDLDIRECT 151.1 07/28/2013   LDLCALC 141* 12/10/2014   ALT 13 12/10/2014   AST 18 12/10/2014   NA 140 12/10/2014   K 3.9 12/10/2014   CL 105 12/10/2014   CREATININE 0.84 12/10/2014  BUN 18 12/10/2014   CO2 28 12/10/2014   TSH 1.28 12/10/2014   MICROALBUR 0.3 07/28/2013    Mm Digital Screening Bilateral  03/31/2014  CLINICAL DATA:  Screening. EXAM: DIGITAL SCREENING BILATERAL MAMMOGRAM WITH CAD COMPARISON:  Previous exam(s). ACR Breast Density Category c: The breast tissue is heterogeneously dense, which may obscure small masses. FINDINGS: There are no findings suspicious for malignancy. Images were processed with CAD. IMPRESSION: No mammographic evidence of malignancy. A result letter of this  screening mammogram will be mailed directly to the patient. RECOMMENDATION: Screening mammogram in one year. (Code:SM-B-01Y) BI-RADS CATEGORY  1: Negative. Electronically Signed   By: Shon Hale M.D.   On: 03/31/2014 15:48     Assessment & Plan:  Plan I have changed Ms. Mesina's metoprolol. I am also having her start on amLODipine. Additionally, I am having her maintain her aspirin, multivitamin, Ascorbic Acid (VITAMIN C PO), and Fish Oil.  Meds ordered this encounter  Medications  . Omega-3 Fatty Acids (FISH OIL) 1000 MG CAPS    Sig: Take 1 capsule by mouth daily.  . metoprolol (TOPROL-XL) 200 MG 24 hr tablet    Sig: TAKE 1 TABLET DAILY    Dispense:  90 tablet    Refill:  1  . amLODipine (NORVASC) 5 MG tablet    Sig: Take 1 tablet (5 mg total) by mouth daily.    Dispense:  90 tablet    Refill:  3    Problem List Items Addressed This Visit    None    Visit Diagnoses    Essential hypertension    -  Primary    Relevant Medications    metoprolol (TOPROL-XL) 200 MG 24 hr tablet    amLODipine (NORVASC) 5 MG tablet     add norvasc 5 mg 1 po   Follow-up: Return in about 2 weeks (around 09/15/2015), or if symptoms worsen or fail to improve, for hypertension.  Garnet Koyanagi, DO

## 2015-09-01 NOTE — Progress Notes (Signed)
Pre visit review using our clinic review tool, if applicable. No additional management support is needed unless otherwise documented below in the visit note. 

## 2015-09-01 NOTE — Patient Instructions (Signed)
Hypertension Hypertension, commonly called high blood pressure, is when the force of blood pumping through your arteries is too strong. Your arteries are the blood vessels that carry blood from your heart throughout your body. A blood pressure reading consists of a higher number over a lower number, such as 110/72. The higher number (systolic) is the pressure inside your arteries when your heart pumps. The lower number (diastolic) is the pressure inside your arteries when your heart relaxes. Ideally you want your blood pressure below 120/80. Hypertension forces your heart to work harder to pump blood. Your arteries may become narrow or stiff. Having untreated or uncontrolled hypertension can cause heart attack, stroke, kidney disease, and other problems. RISK FACTORS Some risk factors for high blood pressure are controllable. Others are not.  Risk factors you cannot control include:   Race. You may be at higher risk if you are African American.  Age. Risk increases with age.  Gender. Men are at higher risk than women before age 45 years. After age 65, women are at higher risk than men. Risk factors you can control include:  Not getting enough exercise or physical activity.  Being overweight.  Getting too much fat, sugar, calories, or salt in your diet.  Drinking too much alcohol. SIGNS AND SYMPTOMS Hypertension does not usually cause signs or symptoms. Extremely high blood pressure (hypertensive crisis) may cause headache, anxiety, shortness of breath, and nosebleed. DIAGNOSIS To check if you have hypertension, your health care provider will measure your blood pressure while you are seated, with your arm held at the level of your heart. It should be measured at least twice using the same arm. Certain conditions can cause a difference in blood pressure between your right and left arms. A blood pressure reading that is higher than normal on one occasion does not mean that you need treatment. If  it is not clear whether you have high blood pressure, you may be asked to return on a different day to have your blood pressure checked again. Or, you may be asked to monitor your blood pressure at home for 1 or more weeks. TREATMENT Treating high blood pressure includes making lifestyle changes and possibly taking medicine. Living a healthy lifestyle can help lower high blood pressure. You may need to change some of your habits. Lifestyle changes may include:  Following the DASH diet. This diet is high in fruits, vegetables, and whole grains. It is low in salt, red meat, and added sugars.  Keep your sodium intake below 2,300 mg per day.  Getting at least 30-45 minutes of aerobic exercise at least 4 times per week.  Losing weight if necessary.  Not smoking.  Limiting alcoholic beverages.  Learning ways to reduce stress. Your health care provider may prescribe medicine if lifestyle changes are not enough to get your blood pressure under control, and if one of the following is true:  You are 18-59 years of age and your systolic blood pressure is above 140.  You are 60 years of age or older, and your systolic blood pressure is above 150.  Your diastolic blood pressure is above 90.  You have diabetes, and your systolic blood pressure is over 140 or your diastolic blood pressure is over 90.  You have kidney disease and your blood pressure is above 140/90.  You have heart disease and your blood pressure is above 140/90. Your personal target blood pressure may vary depending on your medical conditions, your age, and other factors. HOME CARE INSTRUCTIONS    Have your blood pressure rechecked as directed by your health care provider.   Take medicines only as directed by your health care provider. Follow the directions carefully. Blood pressure medicines must be taken as prescribed. The medicine does not work as well when you skip doses. Skipping doses also puts you at risk for  problems.  Do not smoke.   Monitor your blood pressure at home as directed by your health care provider. SEEK MEDICAL CARE IF:   You think you are having a reaction to medicines taken.  You have recurrent headaches or feel dizzy.  You have swelling in your ankles.  You have trouble with your vision. SEEK IMMEDIATE MEDICAL CARE IF:  You develop a severe headache or confusion.  You have unusual weakness, numbness, or feel faint.  You have severe chest or abdominal pain.  You vomit repeatedly.  You have trouble breathing. MAKE SURE YOU:   Understand these instructions.  Will watch your condition.  Will get help right away if you are not doing well or get worse.   This information is not intended to replace advice given to you by your health care provider. Make sure you discuss any questions you have with your health care provider.   Document Released: 10/29/2005 Document Revised: 03/15/2015 Document Reviewed: 08/21/2013 Elsevier Interactive Patient Education 2016 [employer]  

## 2015-10-24 ENCOUNTER — Ambulatory Visit: Payer: BLUE CROSS/BLUE SHIELD | Admitting: Family Medicine

## 2016-01-02 ENCOUNTER — Encounter: Payer: Self-pay | Admitting: Behavioral Health

## 2016-01-02 ENCOUNTER — Telehealth: Payer: Self-pay | Admitting: Behavioral Health

## 2016-01-02 NOTE — Telephone Encounter (Signed)
Pre-Visit Call completed with patient and chart updated.   Pre-Visit Info documented in Specialty Comments under [employer].    

## 2016-01-03 ENCOUNTER — Ambulatory Visit (INDEPENDENT_AMBULATORY_CARE_PROVIDER_SITE_OTHER): Payer: BLUE CROSS/BLUE SHIELD | Admitting: Family Medicine

## 2016-01-03 ENCOUNTER — Encounter: Payer: Self-pay | Admitting: Family Medicine

## 2016-01-03 ENCOUNTER — Other Ambulatory Visit: Payer: Self-pay | Admitting: Family Medicine

## 2016-01-03 VITALS — BP 112/70 | HR 55 | Ht 62.0 in | Wt 168.6 lb

## 2016-01-03 DIAGNOSIS — Z0000 Encounter for general adult medical examination without abnormal findings: Secondary | ICD-10-CM

## 2016-01-03 DIAGNOSIS — Z1211 Encounter for screening for malignant neoplasm of colon: Secondary | ICD-10-CM

## 2016-01-03 DIAGNOSIS — I10 Essential (primary) hypertension: Secondary | ICD-10-CM | POA: Diagnosis not present

## 2016-01-03 DIAGNOSIS — Z1231 Encounter for screening mammogram for malignant neoplasm of breast: Secondary | ICD-10-CM

## 2016-01-03 DIAGNOSIS — R002 Palpitations: Secondary | ICD-10-CM

## 2016-01-03 DIAGNOSIS — Z1239 Encounter for other screening for malignant neoplasm of breast: Secondary | ICD-10-CM

## 2016-01-03 DIAGNOSIS — E785 Hyperlipidemia, unspecified: Secondary | ICD-10-CM | POA: Diagnosis not present

## 2016-01-03 LAB — POCT URINALYSIS DIPSTICK
Bilirubin, UA: NEGATIVE
GLUCOSE UA: NEGATIVE
Ketones, UA: NEGATIVE
Leukocytes, UA: NEGATIVE
NITRITE UA: NEGATIVE
PROTEIN UA: NEGATIVE
RBC UA: NEGATIVE
UROBILINOGEN UA: 0.2
pH, UA: 6

## 2016-01-03 LAB — CBC WITH DIFFERENTIAL/PLATELET
BASOS PCT: 0.5 % (ref 0.0–3.0)
Basophils Absolute: 0 10*3/uL (ref 0.0–0.1)
EOS ABS: 0.1 10*3/uL (ref 0.0–0.7)
Eosinophils Relative: 3.2 % (ref 0.0–5.0)
HEMATOCRIT: 39.8 % (ref 36.0–46.0)
Hemoglobin: 13.5 g/dL (ref 12.0–15.0)
Lymphocytes Relative: 53.2 % — ABNORMAL HIGH (ref 12.0–46.0)
Lymphs Abs: 2.4 10*3/uL (ref 0.7–4.0)
MCHC: 33.9 g/dL (ref 30.0–36.0)
MCV: 85.4 fl (ref 78.0–100.0)
MONO ABS: 0.3 10*3/uL (ref 0.1–1.0)
Monocytes Relative: 5.7 % (ref 3.0–12.0)
NEUTROS ABS: 1.7 10*3/uL (ref 1.4–7.7)
Neutrophils Relative %: 37.4 % — ABNORMAL LOW (ref 43.0–77.0)
PLATELETS: 234 10*3/uL (ref 150.0–400.0)
RBC: 4.67 Mil/uL (ref 3.87–5.11)
RDW: 13.8 % (ref 11.5–15.5)
WBC: 4.5 10*3/uL (ref 4.0–10.5)

## 2016-01-03 LAB — LIPID PANEL
CHOLESTEROL: 242 mg/dL — AB (ref 0–200)
HDL: 76.8 mg/dL (ref >39.00)
LDL CALC: 158 mg/dL — AB (ref 0–99)
NonHDL: 165.59
TRIGLYCERIDES: 39 mg/dL (ref 0.0–149.0)
Total CHOL/HDL Ratio: 3
VLDL: 7.8 mg/dL (ref 0.0–40.0)

## 2016-01-03 LAB — COMPREHENSIVE METABOLIC PANEL
ALT: 16 U/L (ref 0–35)
AST: 24 U/L (ref 0–37)
Albumin: 4.6 g/dL (ref 3.5–5.2)
Alkaline Phosphatase: 58 U/L (ref 39–117)
BUN: 14 mg/dL (ref 6–23)
CALCIUM: 10 mg/dL (ref 8.4–10.5)
CHLORIDE: 102 meq/L (ref 96–112)
CO2: 29 meq/L (ref 19–32)
CREATININE: 0.84 mg/dL (ref 0.40–1.20)
GFR: 91.42 mL/min (ref >60.00)
Glucose, Bld: 94 mg/dL (ref 70–99)
POTASSIUM: 4.3 meq/L (ref 3.5–5.1)
SODIUM: 138 meq/L (ref 135–145)
Total Bilirubin: 0.5 mg/dL (ref 0.2–1.2)
Total Protein: 7.9 g/dL (ref 6.0–8.3)

## 2016-01-03 LAB — TSH: TSH: 1.5 u[IU]/mL (ref 0.35–4.50)

## 2016-01-03 MED ORDER — AMLODIPINE BESYLATE 5 MG PO TABS: 5.0000 mg | ORAL_TABLET | Freq: Every day | ORAL | Status: DC

## 2016-01-03 NOTE — Patient Instructions (Addendum)
Preventive Care for Adults, Female A healthy lifestyle and preventive care can promote health and wellness. Preventive health guidelines for women include the following key practices.  A routine yearly physical is a good way to check with your health care provider about your health and preventive screening. It is a chance to share any concerns and updates on your health and to receive a thorough exam.  Visit your dentist for a routine exam and preventive care every 6 months. Brush your teeth twice a day and floss once a day. Good oral hygiene prevents tooth decay and gum disease.  The frequency of eye exams is based on your age, health, family medical history, use of contact lenses, and other factors. Follow your health care provider's recommendations for frequency of eye exams.  Eat a healthy diet. Foods like vegetables, fruits, whole grains, low-fat dairy products, and lean protein foods contain the nutrients you need without too many calories. Decrease your intake of foods high in solid fats, added sugars, and salt. Eat the right amount of calories for you.Get information about a proper diet from your health care provider, if necessary.  Regular physical exercise is one of the most important things you can do for your health. Most adults should get at least 150 minutes of moderate-intensity exercise (any activity that increases your heart rate and causes you to sweat) each week. In addition, most adults need muscle-strengthening exercises on 2 or more days a week.  Maintain a healthy weight. The body mass index (BMI) is a screening tool to identify possible weight problems. It provides an estimate of body fat based on height and weight. Your health care provider can find your BMI and can help you achieve or maintain a healthy weight.For adults 20 years and older:  A BMI below 18.5 is considered underweight.  A BMI of 18.5 to 24.9 is normal.  A BMI of 25 to 29.9 is considered overweight.  A  BMI of 30 and above is considered obese.  Maintain normal blood lipids and cholesterol levels by exercising and minimizing your intake of saturated fat. Eat a balanced diet with plenty of fruit and vegetables. Blood tests for lipids and cholesterol should begin at age 45 and be repeated every 5 years. If your lipid or cholesterol levels are high, you are over 50, or you are at high risk for heart disease, you may need your cholesterol levels checked more frequently.Ongoing high lipid and cholesterol levels should be treated with medicines if diet and exercise are not working.  If you smoke, find out from your health care provider how to quit. If you do not use tobacco, do not start.  Lung cancer screening is recommended for adults aged 45-80 years who are at high risk for developing lung cancer because of a history of smoking. A yearly low-dose CT scan of the lungs is recommended for people who have at least a 30-pack-year history of smoking and are a current smoker or have quit within the past 15 years. A pack year of smoking is smoking an average of 1 pack of cigarettes a day for 1 year (for example: 1 pack a day for 30 years or 2 packs a day for 15 years). Yearly screening should continue until the smoker has stopped smoking for at least 15 years. Yearly screening should be stopped for people who develop a health problem that would prevent them from having lung cancer treatment.  If you are pregnant, do not drink alcohol. If you are  breastfeeding, be very cautious about drinking alcohol. If you are not pregnant and choose to drink alcohol, do not have more than 1 drink per day. One drink is considered to be 12 ounces (355 mL) of beer, 5 ounces (148 mL) of wine, or 1.5 ounces (44 mL) of liquor.  Avoid use of street drugs. Do not share needles with anyone. Ask for help if you need support or instructions about stopping the use of drugs.  High blood pressure causes heart disease and increases the risk  of stroke. Your blood pressure should be checked at least every 1 to 2 years. Ongoing high blood pressure should be treated with medicines if weight loss and exercise do not work.  If you are 55-79 years old, ask your health care provider if you should take aspirin to prevent strokes.  Diabetes screening is done by taking a blood sample to check your blood glucose level after you have not eaten for a certain period of time (fasting). If you are not overweight and you do not have risk factors for diabetes, you should be screened once every 3 years starting at age 45. If you are overweight or obese and you are 40-70 years of age, you should be screened for diabetes every year as part of your cardiovascular risk assessment.  Breast cancer screening is essential preventive care for women. You should practice "breast self-awareness." This means understanding the normal appearance and feel of your breasts and may include breast self-examination. Any changes detected, no matter how small, should be reported to a health care provider. Women in their 20s and 30s should have a clinical breast exam (CBE) by a health care provider as part of a regular health exam every 1 to 3 years. After age 40, women should have a CBE every year. Starting at age 40, women should consider having a mammogram (breast X-ray test) every year. Women who have a family history of breast cancer should talk to their health care provider about genetic screening. Women at a high risk of breast cancer should talk to their health care providers about having an MRI and a mammogram every year.  Breast cancer gene (BRCA)-related cancer risk assessment is recommended for women who have family members with BRCA-related cancers. BRCA-related cancers include breast, ovarian, tubal, and peritoneal cancers. Having family members with these cancers may be associated with an increased risk for harmful changes (mutations) in the breast cancer genes BRCA1 and  BRCA2. Results of the assessment will determine the need for genetic counseling and BRCA1 and BRCA2 testing.  Your health care provider may recommend that you be screened regularly for cancer of the pelvic organs (ovaries, uterus, and vagina). This screening involves a pelvic examination, including checking for microscopic changes to the surface of your cervix (Pap test). You may be encouraged to have this screening done every 3 years, beginning at age 21.  For women ages 30-65, health care providers may recommend pelvic exams and Pap testing every 3 years, or they may recommend the Pap and pelvic exam, combined with testing for human papilloma virus (HPV), every 5 years. Some types of HPV increase your risk of cervical cancer. Testing for HPV may also be done on women of any age with unclear Pap test results.  Other health care providers may not recommend any screening for nonpregnant women who are considered low risk for pelvic cancer and who do not have symptoms. Ask your health care provider if a screening pelvic exam is right for   you.  If you have had past treatment for cervical cancer or a condition that could lead to cancer, you need Pap tests and screening for cancer for at least 20 years after your treatment. If Pap tests have been discontinued, your risk factors (such as having a new sexual partner) need to be reassessed to determine if screening should resume. Some women have medical problems that increase the chance of getting cervical cancer. In these cases, your health care provider may recommend more frequent screening and Pap tests.  Colorectal cancer can be detected and often prevented. Most routine colorectal cancer screening begins at the age of 50 years and continues through age 75 years. However, your health care provider may recommend screening at an earlier age if you have risk factors for colon cancer. On a yearly basis, your health care provider may provide home test kits to check  for hidden blood in the stool. Use of a small camera at the end of a tube, to directly examine the colon (sigmoidoscopy or colonoscopy), can detect the earliest forms of colorectal cancer. Talk to your health care provider about this at age 50, when routine screening begins. Direct exam of the colon should be repeated every 5-10 years through age 75 years, unless early forms of precancerous polyps or small growths are found.  People who are at an increased risk for hepatitis B should be screened for this virus. You are considered at high risk for hepatitis B if:  You were born in a country where hepatitis B occurs often. Talk with your health care provider about which countries are considered high risk.  Your parents were born in a high-risk country and you have not received a shot to protect against hepatitis B (hepatitis B vaccine).  You have HIV or AIDS.  You use needles to inject street drugs.  You live with, or have sex with, someone who has hepatitis B.  You get hemodialysis treatment.  You take certain medicines for conditions like cancer, organ transplantation, and autoimmune conditions.  Hepatitis C blood testing is recommended for all people born from 1945 through 1965 and any individual with known risks for hepatitis C.  Practice safe sex. Use condoms and avoid high-risk sexual practices to reduce the spread of sexually transmitted infections (STIs). STIs include gonorrhea, chlamydia, syphilis, trichomonas, herpes, HPV, and human immunodeficiency virus (HIV). Herpes, HIV, and HPV are viral illnesses that have no cure. They can result in disability, cancer, and death.  You should be screened for sexually transmitted illnesses (STIs) including gonorrhea and chlamydia if:  You are sexually active and are younger than 24 years.  You are older than 24 years and your health care provider tells you that you are at risk for this type of infection.  Your sexual activity has changed  since you were last screened and you are at an increased risk for chlamydia or gonorrhea. Ask your health care provider if you are at risk.  If you are at risk of being infected with HIV, it is recommended that you take a prescription medicine daily to prevent HIV infection. This is called preexposure prophylaxis (PrEP). You are considered at risk if:  You are sexually active and do not regularly use condoms or know the HIV status of your partner(s).  You take drugs by injection.  You are sexually active with a partner who has HIV.  Talk with your health care provider about whether you are at high risk of being infected with HIV. If   you choose to begin PrEP, you should first be tested for HIV. You should then be tested every 3 months for as long as you are taking PrEP.  Osteoporosis is a disease in which the bones lose minerals and strength with aging. This can result in serious bone fractures or breaks. The risk of osteoporosis can be identified using a bone density scan. Women ages 67 years and over and women at risk for fractures or osteoporosis should discuss screening with their health care providers. Ask your health care provider whether you should take a calcium supplement or vitamin D to reduce the rate of osteoporosis.  Menopause can be associated with physical symptoms and risks. Hormone replacement therapy is available to decrease symptoms and risks. You should talk to your health care provider about whether hormone replacement therapy is right for you.  Use sunscreen. Apply sunscreen liberally and repeatedly throughout the day. You should seek shade when your shadow is shorter than you. Protect yourself by wearing long sleeves, pants, a wide-brimmed hat, and sunglasses year round, whenever you are outdoors.  Once a month, do a whole body skin exam, using a mirror to look at the skin on your back. Tell your health care provider of new moles, moles that have irregular borders, moles that  are larger than a pencil eraser, or moles that have changed in shape or color.  Stay current with required vaccines (immunizations).  Influenza vaccine. All adults should be immunized every year.  Tetanus, diphtheria, and acellular pertussis (Td, Tdap) vaccine. Pregnant women should receive 1 dose of Tdap vaccine during each pregnancy. The dose should be obtained regardless of the length of time since the last dose. Immunization is preferred during the 27th-36th week of gestation. An adult who has not previously received Tdap or who does not know her vaccine status should receive 1 dose of Tdap. This initial dose should be followed by tetanus and diphtheria toxoids (Td) booster doses every 10 years. Adults with an unknown or incomplete history of completing a 3-dose immunization series with Td-containing vaccines should begin or complete a primary immunization series including a Tdap dose. Adults should receive a Td booster every 10 years.  Varicella vaccine. An adult without evidence of immunity to varicella should receive 2 doses or a second dose if she has previously received 1 dose. Pregnant females who do not have evidence of immunity should receive the first dose after pregnancy. This first dose should be obtained before leaving the health care facility. The second dose should be obtained 4-8 weeks after the first dose.  Human papillomavirus (HPV) vaccine. Females aged 13-26 years who have not received the vaccine previously should obtain the 3-dose series. The vaccine is not recommended for use in pregnant females. However, pregnancy testing is not needed before receiving a dose. If a female is found to be pregnant after receiving a dose, no treatment is needed. In that case, the remaining doses should be delayed until after the pregnancy. Immunization is recommended for any person with an immunocompromised condition through the age of 61 years if she did not get any or all doses earlier. During the  3-dose series, the second dose should be obtained 4-8 weeks after the first dose. The third dose should be obtained 24 weeks after the first dose and 16 weeks after the second dose.  Zoster vaccine. One dose is recommended for adults aged 30 years or older unless certain conditions are present.  Measles, mumps, and rubella (MMR) vaccine. Adults born  before 1957 generally are considered immune to measles and mumps. Adults born in 1957 or later should have 1 or more doses of MMR vaccine unless there is a contraindication to the vaccine or there is laboratory evidence of immunity to each of the three diseases. A routine second dose of MMR vaccine should be obtained at least 28 days after the first dose for students attending postsecondary schools, health care workers, or international travelers. People who received inactivated measles vaccine or an unknown type of measles vaccine during 1963-1967 should receive 2 doses of MMR vaccine. People who received inactivated mumps vaccine or an unknown type of mumps vaccine before 1979 and are at high risk for mumps infection should consider immunization with 2 doses of MMR vaccine. For females of childbearing age, rubella immunity should be determined. If there is no evidence of immunity, females who are not pregnant should be vaccinated. If there is no evidence of immunity, females who are pregnant should delay immunization until after pregnancy. Unvaccinated health care workers born before 1957 who lack laboratory evidence of measles, mumps, or rubella immunity or laboratory confirmation of disease should consider measles and mumps immunization with 2 doses of MMR vaccine or rubella immunization with 1 dose of MMR vaccine.  Pneumococcal 13-valent conjugate (PCV13) vaccine. When indicated, a person who is uncertain of his immunization history and has no record of immunization should receive the PCV13 vaccine. All adults 65 years of age and older should receive this  vaccine. An adult aged 19 years or older who has certain medical conditions and has not been previously immunized should receive 1 dose of PCV13 vaccine. This PCV13 should be followed with a dose of pneumococcal polysaccharide (PPSV23) vaccine. Adults who are at high risk for pneumococcal disease should obtain the PPSV23 vaccine at least 8 weeks after the dose of PCV13 vaccine. Adults older than 53 years of age who have normal immune system function should obtain the PPSV23 vaccine dose at least 1 year after the dose of PCV13 vaccine.  Pneumococcal polysaccharide (PPSV23) vaccine. When PCV13 is also indicated, PCV13 should be obtained first. All adults aged 65 years and older should be immunized. An adult younger than age 65 years who has certain medical conditions should be immunized. Any person who resides in a nursing home or long-term care facility should be immunized. An adult smoker should be immunized. People with an immunocompromised condition and certain other conditions should receive both PCV13 and PPSV23 vaccines. People with human immunodeficiency virus (HIV) infection should be immunized as soon as possible after diagnosis. Immunization during chemotherapy or radiation therapy should be avoided. Routine use of PPSV23 vaccine is not recommended for American Indians, [place] Natives, or people younger than 65 years unless there are medical conditions that require PPSV23 vaccine. When indicated, people who have unknown immunization and have no record of immunization should receive PPSV23 vaccine. One-time revaccination 5 years after the first dose of PPSV23 is recommended for people aged 19-64 years who have chronic kidney failure, nephrotic syndrome, asplenia, or immunocompromised conditions. People who received 1-2 doses of PPSV23 before age 65 years should receive another dose of PPSV23 vaccine at age 65 years or later if at least 5 years have passed since the previous dose. Doses of PPSV23 are not  needed for people immunized with PPSV23 at or after age 65 years.  Meningococcal vaccine. Adults with asplenia or persistent complement component deficiencies should receive 2 doses of quadrivalent meningococcal conjugate (MenACWY-D) vaccine. The doses should be obtained   at least 2 months apart. Microbiologists working with certain meningococcal bacteria, [place] recruits, people at risk during an outbreak, and people who travel to or live in countries with a high rate of meningitis should be immunized. A first-year college student up through age 34 years who is living in a residence hall should receive a dose if she did not receive a dose on or after her 16th birthday. Adults who have certain high-risk conditions should receive one or more doses of vaccine.  Hepatitis A vaccine. Adults who wish to be protected from this disease, have certain high-risk conditions, work with hepatitis A-infected animals, work in hepatitis A research labs, or travel to or work in countries with a high rate of hepatitis A should be immunized. Adults who were previously unvaccinated and who anticipate close contact with an international adoptee during the first 60 days after arrival in the [place] from a country with a high rate of hepatitis A should be immunized.  Hepatitis B vaccine. Adults who wish to be protected from this disease, have certain high-risk conditions, may be exposed to blood or other infectious body fluids, are household contacts or sex partners of hepatitis B positive people, are clients or workers in certain care facilities, or travel to or work in countries with a high rate of hepatitis B should be immunized.  Haemophilus influenzae type b (Hib) vaccine. A previously unvaccinated person with asplenia or sickle cell disease or having a scheduled splenectomy should receive 1 dose of Hib vaccine. Regardless of previous immunization, a recipient of a hematopoietic stem cell transplant should receive a  3-dose series 6-12 months after her successful transplant. Hib vaccine is not recommended for adults with HIV infection. Preventive Services / Frequency Ages 35 to 4 years  Blood pressure check.** / Every 3-5 years.  Lipid and cholesterol check.** / Every 5 years beginning at age 60.  Clinical breast exam.** / Every 3 years for women in their 71s and 10s.  BRCA-related cancer risk assessment.** / For women who have family members with a BRCA-related cancer (breast, ovarian, tubal, or peritoneal cancers).  Pap test.** / Every 2 years from ages 76 through 26. Every 3 years starting at age 61 through age 76 or 93 with a history of 3 consecutive normal Pap tests.  HPV screening.** / Every 3 years from ages 37 through ages 60 to 51 with a history of 3 consecutive normal Pap tests.  Hepatitis C blood test.** / For any individual with known risks for hepatitis C.  Skin self-exam. / Monthly.  Influenza vaccine. / Every year.  Tetanus, diphtheria, and acellular pertussis (Tdap, Td) vaccine.** / Consult your health care provider. Pregnant women should receive 1 dose of Tdap vaccine during each pregnancy. 1 dose of Td every 10 years.  Varicella vaccine.** / Consult your health care provider. Pregnant females who do not have evidence of immunity should receive the first dose after pregnancy.  HPV vaccine. / 3 doses over 6 months, if 93 and younger. The vaccine is not recommended for use in pregnant females. However, pregnancy testing is not needed before receiving a dose.  Measles, mumps, rubella (MMR) vaccine.** / You need at least 1 dose of MMR if you were born in 1957 or later. You may also need a 2nd dose. For females of childbearing age, rubella immunity should be determined. If there is no evidence of immunity, females who are not pregnant should be vaccinated. If there is no evidence of immunity, females who are  pregnant should delay immunization until after pregnancy.  Pneumococcal  13-valent conjugate (PCV13) vaccine.** / Consult your health care provider.  Pneumococcal polysaccharide (PPSV23) vaccine.** / 1 to 2 doses if you smoke cigarettes or if you have certain conditions.  Meningococcal vaccine.** / 1 dose if you are age 68 to 8 years and a Market researcher living in a residence hall, or have one of several medical conditions, you need to get vaccinated against meningococcal disease. You may also need additional booster doses.  Hepatitis A vaccine.** / Consult your health care provider.  Hepatitis B vaccine.** / Consult your health care provider.  Haemophilus influenzae type b (Hib) vaccine.** / Consult your health care provider. Ages 7 to 53 years  Blood pressure check.** / Every year.  Lipid and cholesterol check.** / Every 5 years beginning at age 25 years.  Lung cancer screening. / Every year if you are aged 11-80 years and have a 30-pack-year history of smoking and currently smoke or have quit within the past 15 years. Yearly screening is stopped once you have quit smoking for at least 15 years or develop a health problem that would prevent you from having lung cancer treatment.  Clinical breast exam.** / Every year after age 48 years.  BRCA-related cancer risk assessment.** / For women who have family members with a BRCA-related cancer (breast, ovarian, tubal, or peritoneal cancers).  Mammogram.** / Every year beginning at age 41 years and continuing for as long as you are in good health. Consult with your health care provider.  Pap test.** / Every 3 years starting at age 65 years through age 37 or 70 years with a history of 3 consecutive normal Pap tests.  HPV screening.** / Every 3 years from ages 72 years through ages 60 to 40 years with a history of 3 consecutive normal Pap tests.  Fecal occult blood test (FOBT) of stool. / Every year beginning at age 21 years and continuing until age 5 years. You may not need to do this test if you get  a colonoscopy every 10 years.  Flexible sigmoidoscopy or colonoscopy.** / Every 5 years for a flexible sigmoidoscopy or every 10 years for a colonoscopy beginning at age 35 years and continuing until age 48 years.  Hepatitis C blood test.** / For all people born from [birth year] through 1965 and any individual with known risks for hepatitis C.  Skin self-exam. / Monthly.  Influenza vaccine. / Every year.  Tetanus, diphtheria, and acellular pertussis (Tdap/Td) vaccine.** / Consult your health care provider. Pregnant women should receive 1 dose of Tdap vaccine during each pregnancy. 1 dose of Td every 10 years.  Varicella vaccine.** / Consult your health care provider. Pregnant females who do not have evidence of immunity should receive the first dose after pregnancy.  Zoster vaccine.** / 1 dose for adults aged 30 years or older.  Measles, mumps, rubella (MMR) vaccine.** / You need at least 1 dose of MMR if you were born in 1957 or later. You may also need a second dose. For females of childbearing age, rubella immunity should be determined. If there is no evidence of immunity, females who are not pregnant should be vaccinated. If there is no evidence of immunity, females who are pregnant should delay immunization until after pregnancy.  Pneumococcal 13-valent conjugate (PCV13) vaccine.** / Consult your health care provider.  Pneumococcal polysaccharide (PPSV23) vaccine.** / 1 to 2 doses if you smoke cigarettes or if you have certain conditions.  Meningococcal vaccine.** /  Consult your health care provider.  Hepatitis A vaccine.** / Consult your health care provider.  Hepatitis B vaccine.** / Consult your health care provider.  Haemophilus influenzae type b (Hib) vaccine.** / Consult your health care provider. Ages 73 years and over  Blood pressure check.** / Every year.  Lipid and cholesterol check.** / Every 5 years beginning at age 8 years.  Lung cancer screening. / Every year if you  are aged 74-80 years and have a 30-pack-year history of smoking and currently smoke or have quit within the past 15 years. Yearly screening is stopped once you have quit smoking for at least 15 years or develop a health problem that would prevent you from having lung cancer treatment.  Clinical breast exam.** / Every year after age 20 years.  BRCA-related cancer risk assessment.** / For women who have family members with a BRCA-related cancer (breast, ovarian, tubal, or peritoneal cancers).  Mammogram.** / Every year beginning at age 64 years and continuing for as long as you are in good health. Consult with your health care provider.  Pap test.** / Every 3 years starting at age 4 years through age 73 or 27 years with 3 consecutive normal Pap tests. Testing can be stopped between 65 and 70 years with 3 consecutive normal Pap tests and no abnormal Pap or HPV tests in the past 10 years.  HPV screening.** / Every 3 years from ages 41 years through ages 59 or 71 years with a history of 3 consecutive normal Pap tests. Testing can be stopped between 65 and 70 years with 3 consecutive normal Pap tests and no abnormal Pap or HPV tests in the past 10 years.  Fecal occult blood test (FOBT) of stool. / Every year beginning at age 76 years and continuing until age 87 years. You may not need to do this test if you get a colonoscopy every 10 years.  Flexible sigmoidoscopy or colonoscopy.** / Every 5 years for a flexible sigmoidoscopy or every 10 years for a colonoscopy beginning at age 26 years and continuing until age 27 years.  Hepatitis C blood test.** / For all people born from [birth year] through 1963 and any individual with known risks for hepatitis C.  Osteoporosis screening.** / A one-time screening for women ages 74 years and over and women at risk for fractures or osteoporosis.  Skin self-exam. / Monthly.  Influenza vaccine. / Every year.  Tetanus, diphtheria, and acellular pertussis (Tdap/Td)  vaccine.** / 1 dose of Td every 10 years.  Varicella vaccine.** / Consult your health care provider.  Zoster vaccine.** / 1 dose for adults aged 84 years or older.  Pneumococcal 13-valent conjugate (PCV13) vaccine.** / Consult your health care provider.  Pneumococcal polysaccharide (PPSV23) vaccine.** / 1 dose for all adults aged 74 years and older.  Meningococcal vaccine.** / Consult your health care provider.  Hepatitis A vaccine.** / Consult your health care provider.  Hepatitis B vaccine.** / Consult your health care provider.  Haemophilus influenzae type b (Hib) vaccine.** / Consult your health care provider. ** Family history and personal history of risk and conditions may change your health care provider's recommendations.   This information is not intended to replace advice given to you by your health care provider. Make sure you discuss any questions you have with your health care provider.   Document Released: 12/25/2001 Document Revised: 11/19/2014 Document Reviewed: 03/26/2011 Elsevier Interactive Patient Education 2016 [employer]. 6++++++++++

## 2016-01-03 NOTE — Progress Notes (Signed)
Pre visit review using our clinic review tool, if applicable. No additional management support is needed unless otherwise documented below in the visit note. 

## 2016-01-03 NOTE — Progress Notes (Signed)
Subjective:     Jodi Rodriguez is a 55 y.o. female and is here for a comprehensive physical exam. The patient reports no problems.  Social History   Social History  . Marital Status: Married    Spouse Name: N/A  . Number of Children: N/A  . Years of Education: N/A   Occupational History  . Insurance    Social History Main Topics  . Smoking status: Never Smoker   . Smokeless tobacco: Never Used  . Alcohol Use: 0.0 oz/week    0 Standard drinks or equivalent per week     Comment: occass one weekly  . Drug Use: No  . Sexual Activity:    Partners: Male    Birth Control/ Protection: Other-see comments     Comment: vasectomy   Other Topics Concern  . Not on file   Social History Narrative   Exercise-- 3x a week -- walk 1 mile on treadmill or stationary bike   Health Maintenance  Topic Date Due  . Hepatitis C Screening  03/12/1961  . HIV Screening  08/10/1978  . COLONOSCOPY  08/10/2013  . MAMMOGRAM  04/01/2015  . INFLUENZA VACCINE  01/02/2017 (Originally 06/13/2015)  . PAP SMEAR  03/10/2017  . TETANUS/TDAP  05/24/2018    The following portions of the patient's history were reviewed and updated as appropriate:  She  has a past medical history of Hyperlipidemia; Anxiety; Hypertension; and Fibroid. She  does not have any pertinent problems on file. She  has past surgical history that includes Breast biopsy (1981). Her family history includes Alzheimer's disease in her mother; Diabetes in her father; Hypertension in her father, mother, and sister; Thyroid disease in her sister. She  reports that she has never smoked. She has never used smokeless tobacco. She reports that she drinks alcohol. She reports that she does not use illicit drugs. She has a current medication list which includes the following prescription(s): amlodipine, ascorbic acid, aspirin, metoprolol, multivitamin, and fish oil. Current Outpatient Prescriptions on File Prior to Visit  Medication Sig Dispense  Refill  . Ascorbic Acid (VITAMIN C PO) Take by mouth daily.      Marland Kitchen aspirin 81 MG chewable tablet Chew 81 mg by mouth daily.      . metoprolol (TOPROL-XL) 200 MG 24 hr tablet TAKE 1 TABLET DAILY 90 tablet 1  . Multiple Vitamin (MULTIVITAMIN) tablet Take 1 tablet by mouth daily.      . Omega-3 Fatty Acids (FISH OIL) 1000 MG CAPS Take 1 capsule by mouth daily.     No current facility-administered medications on file prior to visit.   She has No Known Allergies..  Review of Systems Review of Systems  Constitutional: Negative for activity change, appetite change and fatigue.  HENT: Negative for hearing loss, congestion, tinnitus and ear discharge.  dentist q56mEyes: Negative for visual disturbance (see optho q1y -- vision corrected to 20/20 with glasses).  Respiratory: Negative for cough, chest tightness and shortness of breath.   Cardiovascular: Negative for chest pain, palpitations and leg swelling.  Gastrointestinal: Negative for abdominal pain, diarrhea, constipation and abdominal distention.  Genitourinary: Negative for urgency, frequency, decreased urine volume and difficulty urinating.  Musculoskeletal: Negative for back pain, arthralgias and gait problem.  Skin: Negative for color change, pallor and rash.  Neurological: Negative for dizziness, light-headedness, numbness and headaches.  Hematological: Negative for adenopathy. Does not bruise/bleed easily.  Psychiatric/Behavioral: Negative for suicidal ideas, confusion, sleep disturbance, self-injury, dysphoric mood, decreased concentration and agitation.  Objective:    BP 112/70 mmHg  Pulse 55  Ht 5' 2"  (1.575 m)  Wt 168 lb 9.6 oz (76.476 kg)  BMI 30.83 kg/m2  SpO2 98% General appearance: alert, cooperative, appears stated age and no distress Head: Normocephalic, without obvious abnormality, atraumatic Eyes: conjunctivae/corneas clear. PERRL, EOM's intact. Fundi benign. Ears: normal TM's and external ear canals both  ears Nose: Nares normal. Septum midline. Mucosa normal. No drainage or sinus tenderness. Throat: lips, mucosa, and tongue normal; teeth and gums normal Neck: no adenopathy, no carotid bruit, no JVD, supple, symmetrical, trachea midline and thyroid not enlarged, symmetric, no tenderness/mass/nodules Back: symmetric, no curvature. ROM normal. No CVA tenderness. Lungs: clear to auscultation bilaterally Breasts: gyn Heart: regular rate and rhythm, S1, S2 normal, no murmur, click, rub or gallop Abdomen: soft, non-tender; bowel sounds normal; no masses,  no organomegaly Pelvic: deferred --gyn Extremities: extremities normal, atraumatic, no cyanosis or edema Pulses: 2+ and symmetric Skin: Skin color, texture, turgor normal. No rashes or lesions Lymph nodes: Cervical, supraclavicular, and axillary nodes normal. Neurologic: Alert and oriented X 3, normal strength and tone. Normal symmetric reflexes. Normal coordination and gait Psych- no depression, no anxiety      Assessment:    Healthy female exam.     Plan:    ghm utd  Check labs See After Visit Summary for Counseling Recommendations    1. Essential hypertension stable - amLODipine (NORVASC) 5 MG tablet; Take 1 tablet (5 mg total) by mouth daily.  Dispense: 90 tablet; Refill: 3 - TSH - POCT urinalysis dipstick - Lipid panel - HIV antibody - CBC with Differential/Platelet - Comp Met (CMET) - Hepatitis C antibody  2. Breast cancer screening   - MM DIGITAL SCREENING BILATERAL; Future  3. Colon cancer screening   - Ambulatory referral to Gastroenterology  4. Preventative health care See above - TSH - POCT urinalysis dipstick - Lipid panel - HIV antibody - CBC with Differential/Platelet - Comp Met (CMET) - Hepatitis C antibody  5. Hyperlipidemia On no meds - TSH - POCT urinalysis dipstick - Lipid panel - HIV antibody - CBC with Differential/Platelet - Comp Met (CMET) - Hepatitis C antibody  6.  Palpitations  - EKG 12-Lead

## 2016-01-04 LAB — HIV ANTIBODY (ROUTINE TESTING W REFLEX): HIV: NONREACTIVE

## 2016-01-04 LAB — HEPATITIS C ANTIBODY: HCV Ab: NEGATIVE

## 2016-01-26 ENCOUNTER — Ambulatory Visit
Admission: RE | Admit: 2016-01-26 | Discharge: 2016-01-26 | Disposition: A | Discharge status: Home / Self Care | Payer: BLUE CROSS/BLUE SHIELD | Source: Ambulatory Visit | Attending: Family Medicine | Admitting: Family Medicine

## 2016-01-26 DIAGNOSIS — Z1231 Encounter for screening mammogram for malignant neoplasm of breast: Secondary | ICD-10-CM

## 2016-04-24 ENCOUNTER — Other Ambulatory Visit: Payer: Self-pay | Admitting: Family Medicine

## 2016-07-19 ENCOUNTER — Ambulatory Visit (INDEPENDENT_AMBULATORY_CARE_PROVIDER_SITE_OTHER): Payer: BLUE CROSS/BLUE SHIELD | Admitting: Women's Health

## 2016-07-19 ENCOUNTER — Encounter: Payer: Self-pay | Admitting: Women's Health

## 2016-07-19 VITALS — BP 118/80 | Ht 62.0 in | Wt 145.0 lb

## 2016-07-19 DIAGNOSIS — Z01419 Encounter for gynecological examination (general) (routine) without abnormal findings: Secondary | ICD-10-CM | POA: Diagnosis not present

## 2016-07-19 NOTE — Patient Instructions (Signed)
Colonoscopy  Dr Celine Ahr  [phone]  Menopause is a normal process in which your reproductive ability comes to an end. This process happens gradually over a span of months to years, usually between the ages of 67 and 53. Menopause is complete when you have missed 12 consecutive menstrual periods. It is important to talk with your health care provider about some of the most common conditions that affect postmenopausal women, such as heart disease, cancer, and bone loss (osteoporosis). Adopting a healthy lifestyle and getting preventive care can help to promote your health and wellness. Those actions can also lower your chances of developing some of these common conditions. WHAT SHOULD I KNOW ABOUT MENOPAUSE? During menopause, you may experience a number of symptoms, such as:  Moderate-to-severe hot flashes.  Night sweats.  Decrease in sex drive.  Mood swings.  Headaches.  Tiredness.  Irritability.  Memory problems.  Insomnia. Choosing to treat or not to treat menopausal changes is an individual decision that you make with your health care provider. WHAT SHOULD I KNOW ABOUT HORMONE REPLACEMENT THERAPY AND SUPPLEMENTS? Hormone therapy products are effective for treating symptoms that are associated with menopause, such as hot flashes and night sweats. Hormone replacement carries certain risks, especially as you become older. If you are thinking about using estrogen or estrogen with progestin treatments, discuss the benefits and risks with your health care provider. WHAT SHOULD I KNOW ABOUT HEART DISEASE AND STROKE? Heart disease, heart attack, and stroke become more likely as you age. This may be due, in part, to the hormonal changes that your body experiences during menopause. These can affect how your body processes dietary fats, triglycerides, and cholesterol. Heart attack and stroke are both medical emergencies. There are many things that you can do to help prevent heart disease  and stroke:  Have your blood pressure checked at least every 1-2 years. High blood pressure causes heart disease and increases the risk of stroke.  If you are 9-61 years old, ask your health care provider if you should take aspirin to prevent a heart attack or a stroke.  Do not use any tobacco products, including cigarettes, chewing tobacco, or electronic cigarettes. If you need help quitting, ask your health care provider.  It is important to eat a healthy diet and maintain a healthy weight.  Be sure to include plenty of vegetables, fruits, low-fat dairy products, and lean protein.  Avoid eating foods that are high in solid fats, added sugars, or salt (sodium).  Get regular exercise. This is one of the most important things that you can do for your health.  Try to exercise for at least 150 minutes each week. The type of exercise that you do should increase your heart rate and make you sweat. This is known as moderate-intensity exercise.  Try to do strengthening exercises at least twice each week. Do these in addition to the moderate-intensity exercise.  Know your numbers.Ask your health care provider to check your cholesterol and your blood glucose. Continue to have your blood tested as directed by your health care provider. WHAT SHOULD I KNOW ABOUT CANCER SCREENING? There are several types of cancer. Take the following steps to reduce your risk and to catch any cancer development as early as possible. Breast Cancer  Practice breast self-awareness.  This means understanding how your breasts normally appear and feel.  It also means doing regular breast self-exams. Let your health care provider know about any changes, no matter how small.  If you  are 53 or older, have a clinician do a breast exam (clinical breast exam or CBE) every year. Depending on your age, family history, and medical history, it may be recommended that you also have a yearly breast X-ray (mammogram).  If you  have a family history of breast cancer, talk with your health care provider about genetic screening.  If you are at high risk for breast cancer, talk with your health care provider about having an MRI and a mammogram every year.  Breast cancer (BRCA) gene test is recommended for women who have family members with BRCA-related cancers. Results of the assessment will determine the need for genetic counseling and BRCA1 and for BRCA2 testing. BRCA-related cancers include these types:  Breast. This occurs in males or females.  Ovarian.  Tubal. This may also be called fallopian tube cancer.  Cancer of the abdominal or pelvic lining (peritoneal cancer).  Prostate.  Pancreatic. Cervical, Uterine, and Ovarian Cancer Your health care provider may recommend that you be screened regularly for cancer of the pelvic organs. These include your ovaries, uterus, and vagina. This screening involves a pelvic exam, which includes checking for microscopic changes to the surface of your cervix (Pap test).  For women ages 53-65, health care providers may recommend a pelvic exam and a Pap test every three years. For women ages 53-65, they may recommend the Pap test and pelvic exam, combined with testing for human papilloma virus (HPV), every five years. Some types of HPV increase your risk of cervical cancer. Testing for HPV may also be done on women of any age who have unclear Pap test results.  Other health care providers may not recommend any screening for nonpregnant women who are considered low risk for pelvic cancer and have no symptoms. Ask your health care provider if a screening pelvic exam is right for you.  If you have had past treatment for cervical cancer or a condition that could lead to cancer, you need Pap tests and screening for cancer for at least 20 years after your treatment. If Pap tests have been discontinued for you, your risk factors (such as having a new sexual partner) need to be reassessed  to determine if you should start having screenings again. Some women have medical problems that increase the chance of getting cervical cancer. In these cases, your health care provider may recommend that you have screening and Pap tests more often.  If you have a family history of uterine cancer or ovarian cancer, talk with your health care provider about genetic screening.  If you have vaginal bleeding after reaching menopause, tell your health care provider.  There are currently no reliable tests available to screen for ovarian cancer. Lung Cancer Lung cancer screening is recommended for adults 22-98 years old who are at high risk for lung cancer because of a history of smoking. A yearly low-dose CT scan of the lungs is recommended if you:  Currently smoke.  Have a history of at least 30 pack-years of smoking and you currently smoke or have quit within the past 15 years. A pack-year is smoking an average of one pack of cigarettes per day for one year. Yearly screening should:  Continue until it has been 15 years since you quit.  Stop if you develop a health problem that would prevent you from having lung cancer treatment. Colorectal Cancer  This type of cancer can be detected and can often be prevented.  Routine colorectal cancer screening usually begins at age 22  and continues through age 78.  If you have risk factors for colon cancer, your health care provider may recommend that you be screened at an earlier age.  If you have a family history of colorectal cancer, talk with your health care provider about genetic screening.  Your health care provider may also recommend using home test kits to check for hidden blood in your stool.  A small camera at the end of a tube can be used to examine your colon directly (sigmoidoscopy or colonoscopy). This is done to check for the earliest forms of colorectal cancer.  Direct examination of the colon should be repeated every 5-10 years until  age 38. However, if early forms of precancerous polyps or small growths are found or if you have a family history or genetic risk for colorectal cancer, you may need to be screened more often. Skin Cancer  Check your skin from head to toe regularly.  Monitor any moles. Be sure to tell your health care provider:  About any new moles or changes in moles, especially if there is a change in a mole's shape or color.  If you have a mole that is larger than the size of a pencil eraser.  If any of your family members has a history of skin cancer, especially at a Toy Eisemann age, talk with your health care provider about genetic screening.  Always use sunscreen. Apply sunscreen liberally and repeatedly throughout the day.  Whenever you are outside, protect yourself by wearing long sleeves, pants, a wide-brimmed hat, and sunglasses. WHAT SHOULD I KNOW ABOUT OSTEOPOROSIS? Osteoporosis is a condition in which bone destruction happens more quickly than new bone creation. After menopause, you may be at an increased risk for osteoporosis. To help prevent osteoporosis or the bone fractures that can happen because of osteoporosis, the following is recommended:  If you are 31-69 years old, get at least 1,000 mg of calcium and at least 600 mg of vitamin D per day.  If you are older than age 62 but younger than age 21, get at least 1,200 mg of calcium and at least 600 mg of vitamin D per day.  If you are older than age 75, get at least 1,200 mg of calcium and at least 800 mg of vitamin D per day. Smoking and excessive alcohol intake increase the risk of osteoporosis. Eat foods that are rich in calcium and vitamin D, and do weight-bearing exercises several times each week as directed by your health care provider. WHAT SHOULD I KNOW ABOUT HOW MENOPAUSE AFFECTS [place]? Depression may occur at any age, but it is more common as you become older. Common symptoms of depression include:  Low or sad  mood.  Changes in sleep patterns.  Changes in appetite or eating patterns.  Feeling an overall lack of motivation or enjoyment of activities that you previously enjoyed.  Frequent crying spells. Talk with your health care provider if you think that you are experiencing depression. WHAT SHOULD I KNOW ABOUT IMMUNIZATIONS? It is important that you get and maintain your immunizations. These include:  Tetanus, diphtheria, and pertussis (Tdap) booster vaccine.  Influenza every year before the flu season begins.  Pneumonia vaccine.  Shingles vaccine. Your health care provider may also recommend other immunizations.   This information is not intended to replace advice given to you by your health care provider. Make sure you discuss any questions you have with your health care provider.   Document Released: 12/21/2005 Document Revised: 11/19/2014  Document Reviewed: 07/01/2014 Elsevier Interactive Patient Education [employer].

## 2016-07-19 NOTE — Progress Notes (Signed)
Jodi Rodriguez June 26, 1961 [id number]    History:    Presents for annual exam.  Amenorrheic for greater than 18 months with hot flushes that are more bearable. History of vasectomy. Fibroids asymptomatic. Normal Pap and mammogram history. Has not had a screening colonoscopy. Hypertension and hypercholesterolemia managed by primary care. Has lost greater than 30 pounds in the past 2 years with diet and exercise.  Past medical history, past surgical history, family history and social history were all reviewed and documented in the EPIC chart. 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 Daughter is due to graduate from [employer], son now working in [place]. Parents hypertension, mother Alzheimer's, father diabetes.  ROS:  A ROS was performed and pertinent positives and negatives are included.  Exam:  Vitals:   07/19/16 1026  BP: 118/80  Weight: 145 lb (65.8 kg)  Height: 5\' 2"  (1.575 m)   Body mass index is 26.52 kg/m.   General appearance:  Normal Thyroid:  Symmetrical, normal in size, without palpable masses or nodularity. Respiratory  Auscultation:  Clear without wheezing or rhonchi Cardiovascular  Auscultation:  Regular rate, without rubs, murmurs or gallops  Edema/varicosities:  Not grossly evident Abdominal  Soft,nontender, without masses, guarding or rebound.  Liver/spleen:  No organomegaly noted  Hernia:  None appreciated  Skin  Inspection:  Grossly normal   Breasts: Examined lying and sitting.     Right: Without masses, retractions, discharge or axillary adenopathy.     Left: Without masses, retractions, discharge or axillary adenopathy. Gentitourinary   Inguinal/mons:  Normal without inguinal adenopathy  External genitalia:  Normal  BUS/Urethra/Skene's glands:  Normal  Vagina:  Normal  Cervix:  Normal  Uterus:  12 week fibroid uterus shape and contour.  Midline and mobile  Adnexa/parametria:     Rt: Without masses or tenderness.   Lt: Without masses or  tenderness.  Anus and perineum: Normal  Digital rectal exam: Normal sphincter tone without palpated masses or tenderness  Assessment/Plan:  55 y.o. MBF G2 P2  for annual exam no complaints other than difficulty sleeping and occasional hot flushes.  Postmenopausal on no HRT with no bleeding greater than 18 months Asymptomatic fibroid uterus Hypertension-primary care manages labs and meds  Plan: Options for symptom relief of menopausal symptoms and insomnia reviewed, declines . Reviewed importance of screening colonoscopy [facility] information given and reviewed. SBE's, continue annual 3-D screening mammogram history of dense breasts. Congratulated on weight loss and healthy lifestyle of diet and exercise. Instructed to have vitamin D level checked with next blood draw at primary care. Instructed to call if any further bleeding. UA, Pap with HR HPV typing, new screening guidelines reviewed.    [place], 12:12 PM 07/19/2016

## 2016-07-23 LAB — PAP, TP IMAGING W/ HPV RNA, RFLX HPV TYPE 16,18/45: HPV MRNA, HIGH RISK: NOT DETECTED

## 2016-09-20 ENCOUNTER — Encounter: Payer: Self-pay | Admitting: Women's Health

## 2016-09-20 ENCOUNTER — Ambulatory Visit (INDEPENDENT_AMBULATORY_CARE_PROVIDER_SITE_OTHER): Payer: BLUE CROSS/BLUE SHIELD | Admitting: Women's Health

## 2016-09-20 VITALS — BP 150/80 | Ht 62.0 in | Wt 145.0 lb

## 2016-09-20 DIAGNOSIS — N950 Postmenopausal bleeding: Secondary | ICD-10-CM | POA: Diagnosis not present

## 2016-09-20 NOTE — Patient Instructions (Addendum)
Postmenopausal Bleeding Postmenopausal bleeding is any bleeding a woman has after she has entered into menopause. Menopause is the end of a woman's fertile years. After menopause, a woman no longer ovulates or has menstrual periods.  Postmenopausal bleeding can be caused by various things. Any type of postmenopausal bleeding, even if it appears to be a typical menstrual period, is concerning. This should be evaluated by your health care provider. Any treatment will depend on the cause of the bleeding. HOME CARE INSTRUCTIONS Monitor your condition for any changes. The following actions may help to alleviate any discomfort you are experiencing:  Avoid the use of tampons and douches as directed by your health care provider.  Change your pads frequently.  Get regular pelvic exams and Pap tests.  Keep all follow-up appointments for diagnostic tests as directed by your health care provider. SEEK MEDICAL CARE IF:   Your bleeding lasts more than 1 week.  You have abdominal pain.  You have bleeding with sexual intercourse. SEEK IMMEDIATE MEDICAL CARE IF:   You have a fever, chills, headache, dizziness, muscle aches, and bleeding.  You have severe pain with bleeding.  You are passing blood clots.  You have bleeding and need more than 1 pad an hour.  You feel faint. MAKE SURE YOU:  Understand these instructions.  Will watch your condition.  Will get help right away if you are not doing well or get worse.   This information is not intended to replace advice given to you by your health care provider. Make sure you discuss any questions you have with your health care provider.   Document Released: 02/06/2006 Document Revised: 08/19/2013 Document Reviewed: 05/28/2013 Elsevier Interactive Patient Education 2016 [place] is a procedure to examine the inside of your uterus. This exam uses sound waves sent to a computer to make real-time pictures of  the inside of your uterus. To get the best images, a germ-free, saltwater solution (sterile saline) is injected into your uterus through your vagina. A sonohysterogram can show whether there is scarring or abnormal growths inside your uterus. It can also show whether your uterus is an abnormal shape or whether the lining is too thin.  LET [facility] CARE PROVIDER KNOW ABOUT:  Any allergies you have.  All medicines you are taking, including vitamins, herbs, eyedrops, creams, and over-the-counter medicines.  Previous problems you or members of your family have had with the use of anesthetics.  Any blood disorders you have.  Previous surgeries you have had.  Medical conditions you have.  The dates of your last period.  Possibility of pregnancy. RISKS AND COMPLICATIONS Generally, a sonohysterogram is a safe procedure. However, as with any procedure, problems can occur. Possible problems include:  Bleeding.  Infection. BEFORE THE PROCEDURE  Your health care provider may have you take an over-the-counter pain medicine.  You may get a prescription for antibiotic medicine.  Your health care provider may give you a pregnancy test before the procedure.  You will empty your bladder. PROCEDURE   You will lie down on the examining table with your knees raised or your feet in stirrups.  Your health care provider may do a pelvic exam before starting the procedure.  A slender, handheld device (transducer) will be lubricated and placed into your vagina.  The transducer will be positioned to send sound waves to your uterus.  The sound waves will bounce back to the transducer. They will be sent to a computer.  The computer will  turn the sound waves into live images.  Your health care provider will view the images on a screen during the procedure.  Your health care provider will remove the transducer from your vagina and use an instrument to widen the opening (speculum).  A swab will  be used to clean the opening to your uterus (cervix).  A long, thin tube (catheter) will then be placed through your cervix into your uterus.  Your health care provider will fill your uterus with sterile saline through the catheter. You may feel some cramping.  The speculum will be removed.  The transducer will be placed back in your vagina to take more images. AFTER THE PROCEDURE After the procedure, it is typical to have light bleeding from your vagina, cramping, and watery vaginal discharge.    This information is not intended to replace advice given to you by your health care provider. Make sure you discuss any questions you have with your health care provider.   Document Released: 03/15/2014 Document Reviewed: 03/15/2014 Elsevier Interactive Patient Education [employer].

## 2016-09-20 NOTE — Progress Notes (Signed)
Presents for evaluation of postmenopausal bleeding X 1 day. Bleeding was dark red yesterday, bright red this morning. Postmenopausal for 2 years, not on HRT. Denies cramping, breast tenderness, or PMS type symptoms prior to bleeding. Denies vaginal discharge, abdominal pain or urinary symptoms. Same partner.   Hypertension on medication. Has lost 50 pounds over the past 2 years with diet and exercise.  Exam: Appears well. Speculum exam: Dark blood with clots . Cervix was pink without any lesions or polyps. Bimanual no CMT or adnexal fullness, uterus large/fibroids 12 week size uterus.  Postmenopausal bleeding/first occurrence Fibroid uterus    Plan: Follow up with Dr. Toney Rakes for sonohsterogram and biopsy. Reviewed blood pressure slightly elevated today, will keep follow-up with primary care, home blood pressure monitoring and continue medication.

## 2016-09-24 ENCOUNTER — Telehealth: Payer: Self-pay | Admitting: *Deleted

## 2016-09-24 ENCOUNTER — Other Ambulatory Visit: Payer: Self-pay | Admitting: Gynecology

## 2016-09-24 DIAGNOSIS — N950 Postmenopausal bleeding: Secondary | ICD-10-CM

## 2016-09-24 NOTE — Telephone Encounter (Signed)
Per Jana Half at [place] ref # [id number] [facility]Gm covered at 80% after $3000 deducitble is met, $2862.24 met, remaining $137.76 plus 20% of charges. Pt's approx responsibility is $335.51. Pt will be informed KW CMA

## 2016-10-08 ENCOUNTER — Ambulatory Visit (INDEPENDENT_AMBULATORY_CARE_PROVIDER_SITE_OTHER): Payer: BLUE CROSS/BLUE SHIELD | Admitting: Gynecology

## 2016-10-08 ENCOUNTER — Ambulatory Visit (INDEPENDENT_AMBULATORY_CARE_PROVIDER_SITE_OTHER): Payer: BLUE CROSS/BLUE SHIELD

## 2016-10-08 ENCOUNTER — Encounter: Payer: Self-pay | Admitting: Gynecology

## 2016-10-08 ENCOUNTER — Other Ambulatory Visit: Payer: Self-pay | Admitting: Gynecology

## 2016-10-08 DIAGNOSIS — N83202 Unspecified ovarian cyst, left side: Secondary | ICD-10-CM

## 2016-10-08 DIAGNOSIS — N950 Postmenopausal bleeding: Secondary | ICD-10-CM

## 2016-10-08 NOTE — Progress Notes (Signed)
   Patient is a 55 year old that presented to the office today as part of her evaluation for postmenopausal bleeding. Patient had been seen in the office in November 9 with point issued had bleeding for several days. She is on no hormone replacement therapy has no vasomotor symptoms. Currently not bleeding today. Patient with past history of fibroid uterus.  Ultrasound 2011: Uterus measured 12.2 x 9.0 x 9.5 cm with several intramural fibroids as follows: 32 x 27 mm, 37 x 40 mm, 19 x 14 mm, 34 x 25 mm, 44 x 34 mm. Right and left ovary were normal  Ultrasound/sono hysterogram 2017 (today) Uterus measured 10.2 x 8.2 x 7.4 cm with endometrial stripe of 2 mm. Patient with 7 intramural fibroids the largest one measuring 37 x 28 mm. Patient had a left ovarian thinwall echo-free cyst with negative color flow measuring 29 x 24 mm. Arterial blood flow was seen going to the ovary. The cervix is then cleansed with Betadine solution and a sterile catheter was introduced into the uterine cavity normal saline was instilled and no defect was noted. Her endometrial stripe was 2 mm. A single-tooth tenaculum was then placed on the anterior cervical lip and a sterile Pipelle was introduced into the uterine cavity and several passes were required to obtain some tissue to submit for histological evaluation. Patient tolerated procedure well.  Assessment/plan: Patient with isolated event of postmenopausal bleeding for a few days no further bleeding. History of fibroid uterus it appears have gone smaller compare with ultrasound 2011. Patient with an endometrial stripe. Endometrial biopsy done today result pending at time of this dictation. We send her bleeding was attributed to a atrophic bleed. Also it was noted she had a thin walled echo-free left ovarian cyst for which she will return back in 3 months for follow-up. A CA 125 will be drawn today.

## 2016-10-08 NOTE — Patient Instructions (Signed)
CA-125 Tumor Marker Test Why am I having this test? This test is used to check the level of cancer antigen 125 (CA-125) in your blood. The CA-125 tumor marker test can be helpful in detecting ovarian cancer. The test is only performed if you are considered at high risk for ovarian cancer. Your health care provider may recommend this test if:  You have a strong family history of ovarian cancer.  You have a breast cancer antigen (BRCA) genetic defect. If you have already been diagnosed with ovarian cancer, your health care provider may use this test to help identify the extent of the disease and to monitor your response to treatment. What kind of sample is taken? A blood sample is required for this test. It is usually collected by inserting a needle into a vein. How do I prepare for this test? There is no preparation required for this test. What are the reference ranges? Reference ranges are considered healthy ranges established after testing a large group of healthy people. Reference ranges may vary among different people, labs, and hospitals. It is your responsibility to obtain your test results. Ask the lab or department performing the test when and how you will get your results. The reference range for this test is 0-35 units/mL or less than 35 kunits/L (SI units). What do the results mean? Increased levels of CA-125 may indicate:  Certain types of cancer, including:  Ovarian cancer.  Pancreatic cancer.  Colon cancer.  Lung cancer.  Breast cancer.  Lymphoma.  Noncancerous (benign) disorders, including:  Cirrhosis.  Pregnancy.  Endometriosis.  Pancreatitis.  Pelvic inflammatory disease (PID). Talk with your health care provider to discuss your results, treatment options, and if necessary, the need for more tests. Talk with your health care provider if you have any questions about your results. Talk with your health care provider to discuss your results, treatment options,  and if necessary, the need for more tests. Talk with your health care provider if you have any questions about your results. This information is not intended to replace advice given to you by your health care provider. Make sure you discuss any questions you have with your health care provider. Document Released: 11/20/2004 Document Revised: 07/03/2016 Document Reviewed: 03/18/2014 Elsevier Interactive Patient Education  2017 [place]. Ovarian Cyst  An ovarian cyst is a fluid-filled sac that forms on an ovary. The ovaries are small organs that produce eggs in women. Various types of cysts can form on the ovaries. Some may cause symptoms and require treatment. Most ovarian cysts go away on their own, are not cancerous (are benign), and do not cause problems. Common types of ovarian cysts include:  Functional (follicle) cysts.  Occur during the menstrual cycle, and usually go away with the next menstrual cycle if you do not get pregnant.  Usually cause no symptoms.  Endometriomas.  Are cysts that form from the tissue that lines the uterus (endometrium).  Are sometimes called "chocolate cysts" because they become filled with blood that turns brown.  Can cause pain in the lower abdomen during intercourse and during your period.  Cystadenoma cysts.  Develop from cells on the outside surface of the ovary.  Can get very large and cause lower abdomen pain and pain with intercourse.  Can cause severe pain if they twist or break open (rupture).  Dermoid cysts.  Are sometimes found in both ovaries.  May contain different kinds of body tissue, such as skin, teeth, hair, or cartilage.  Usually do not  cause symptoms unless they get very big.  Theca lutein cysts.  Occur when too much of a certain hormone (human chorionic gonadotropin) is produced and overstimulates the ovaries to produce an egg.  Are most common after having procedures used to assist with the conception of a baby (in  vitro fertilization). What are the causes? Ovarian cysts may be caused by:  Ovarian hyperstimulation syndrome. This is a condition that can develop from taking fertility medicines. It causes multiple large ovarian cysts to form.  Polycystic ovarian syndrome (PCOS). This is a common hormonal disorder that can cause ovarian cysts, as well as problems with your period or fertility. What increases the risk? The following factors may make you more likely to develop ovarian cysts:  Being overweight or obese.  Taking fertility medicines.  Taking certain forms of hormonal birth control.  Smoking. What are the signs or symptoms? Many ovarian cysts do not cause symptoms. If symptoms are present, they may include:  Pelvic pain or pressure.  Pain in the lower abdomen.  Pain during sex.  Abdominal swelling.  Abnormal menstrual periods.  Increasing pain with menstrual periods. How is this diagnosed? These cysts are commonly found during a routine pelvic exam. You may have tests to find out more about the cyst, such as:  Ultrasound.  X-ray of the pelvis.  CT scan.  MRI.  Blood tests. How is this treated? Many ovarian cysts go away on their own without treatment. Your health care provider may want to check your cyst regularly for 2-3 months to see if it changes. If you are in menopause, it is especially important to have your cyst monitored closely because menopausal women have a higher rate of ovarian cancer. When treatment is needed, it may include:  Medicines to help relieve pain.  A procedure to drain the cyst (aspiration).  Surgery to remove the whole cyst.  Hormone treatment or birth control pills. These methods are sometimes used to help dissolve a cyst. Follow these instructions at home:  Take over-the-counter and prescription medicines only as told by your health care provider.  Do not drive or use heavy machinery while taking prescription pain medicine.  Get  regular pelvic exams and Pap tests as often as told by your health care provider.  Return to your normal activities as told by your health care provider. Ask your health care provider what activities are safe for you.  Do not use any products that contain nicotine or tobacco, such as cigarettes and e-cigarettes. If you need help quitting, ask your health care provider.  Keep all follow-up visits as told by your health care provider. This is important. Contact a health care provider if:  Your periods are late, irregular, or painful, or they stop.  You have pelvic pain that does not go away.  You have pressure on your bladder or trouble emptying your bladder completely.  You have pain during sex.  You have any of the following in your abdomen:  A feeling of fullness.  Pressure.  Discomfort.  Pain that does not go away.  Swelling.  You feel generally ill.  You become constipated.  You lose your appetite.  You develop severe acne.  You start to have more body hair and facial hair.  You are gaining weight or losing weight without changing your exercise and eating habits.  You think you may be pregnant. Get help right away if:  You have abdominal pain that is severe or gets worse.  You cannot eat   or drink without vomiting.  You suddenly develop a fever.  Your menstrual period is much heavier than usual. This information is not intended to replace advice given to you by your health care provider. Make sure you discuss any questions you have with your health care provider. Document Released: 10/29/2005 Document Revised: 05/18/2016 Document Reviewed: 04/01/2016 Elsevier Interactive Patient Education  2017 [employer]  

## 2016-10-09 LAB — CA 125: CA 125: 10 U/mL (ref <35)

## 2017-01-02 ENCOUNTER — Encounter: Payer: Self-pay | Admitting: Gynecology

## 2017-01-02 ENCOUNTER — Ambulatory Visit (INDEPENDENT_AMBULATORY_CARE_PROVIDER_SITE_OTHER): Payer: BLUE CROSS/BLUE SHIELD | Admitting: Gynecology

## 2017-01-02 ENCOUNTER — Ambulatory Visit (INDEPENDENT_AMBULATORY_CARE_PROVIDER_SITE_OTHER): Payer: BLUE CROSS/BLUE SHIELD

## 2017-01-02 VITALS — BP 130/82 | Ht 62.0 in | Wt 145.0 lb

## 2017-01-02 DIAGNOSIS — Z8742 Personal history of other diseases of the female genital tract: Secondary | ICD-10-CM

## 2017-01-02 DIAGNOSIS — N950 Postmenopausal bleeding: Secondary | ICD-10-CM

## 2017-01-02 DIAGNOSIS — D251 Intramural leiomyoma of uterus: Secondary | ICD-10-CM

## 2017-01-02 DIAGNOSIS — N951 Menopausal and female climacteric states: Secondary | ICD-10-CM | POA: Diagnosis not present

## 2017-01-02 DIAGNOSIS — N83202 Unspecified ovarian cyst, left side: Secondary | ICD-10-CM | POA: Diagnosis not present

## 2017-01-02 NOTE — Progress Notes (Signed)
   Patient is a 56 year old that presented to the office for 3 month follow-up ultrasound as a result of her history of postmenopausal bleeding on no hormone replacement therapy and ovarian cyst and fibroids. She otherwise reports no vaginal bleeding.Her history is as follows:  Ultrasound 2011: Uterus measured 12.2 x 9.0 x 9.5 cm with several intramural fibroids as follows: 32 x 27 mm, 37 x 40 mm, 19 x 14 mm, 34 x 25 mm, 44 x 34 mm. Right and left ovary were normal  Ultrasound/sono hysterogram 2017 (today) Uterus measured 10.2 x 8.2 x 7.4 cm with endometrial stripe of 2 mm. Patient with 7 intramural fibroids the largest one measuring 37 x 28 mm. Patient had a left ovarian thinwall echo-free cyst with negative color flow measuring 29 x 24 mm. Arterial blood flow was seen going to the ovary. The cervix is then cleansed with Betadine solution and a sterile catheter was introduced into the uterine cavity normal saline was instilled and no defect was noted. Her endometrial stripe was 2 she had an endometrial biopsy at that setting and pathology report demonstrated the following:  Diagnosis Endometrium, biopsy - ATROPHIC ENDOMETRIUM WITH BREAKDOWN AND FRAGMENTED BENIGN CERVICAL TRANSFORMATION ZONE MUCOSA. - NO HYPERPLASIA, ATYPIA, CERVICAL DYSPLASIA OR MALIGNANCY IDENTIFIED  Patient had a CA 125 which was normal with a value of 10  Ultrasound today: Uterus measured 10.2 x 7.9 x 7.1 cm within mutual stripe of 3.5 mm. Numerous intramural fibroid with calcification the largest one measuring 36 x 22 mm, 27 x 29 mm, no change from previous scan of November 2017. Endometrium within normal limits. Right and left ovary normal. Left ovary seen on transabdominal imaging only previous cyst of the left ovary no longer present there was mild amount of fluid in the cul-de-sac 24 x 9 mm. No apparent adnexal masses.  Patient was complaining of hot flashes irritability and decreased libido especially at  night.  Assessment/plan: 56 year old menopausal patient on no hormone replacement therapy was possibly perimenopausal last year which she had vaginal bleeding. Endometrial biopsy sonohysterogram benign. Patient with several fibroids stable in size no change. Thin endometrial stripe on ultrasound today. No further evaluation at this time required. Because of patient's vasomotor symptoms of hot flashes, irritability mood swing and decreased libido we had a lengthy discussion of hormone replacement therapy the risks benefits and pros and cons to include DVT pulmonary embolism breast cancer. We also discussed past women's health initiative study as well as a new guidelines on hormone replacement therapy. Patient would like to wait a little longer and she will use Peppermint oil 2 dabbs behind H here when necessary. We are going to provide her with literature information on hormone replacement therapy and on the menopause.  Greater than 90% of time was spent counseling correlating care for this patient. Time of consultation 15 minutes

## 2017-01-02 NOTE — Patient Instructions (Signed)
Menopause and Hormone Replacement Therapy Introduction WHAT IS HORMONE REPLACEMENT THERAPY? Hormone replacement therapy (HRT) is the use of artificial (synthetic) hormones to replace hormones that your body stops producing during menopause. Menopause is the normal time of life when menstrual periods stop completely and the ovaries stop producing the female hormones estrogen and progesterone. This lack of hormones can affect your health and cause undesirable symptoms. HRT can relieve some of those symptoms. WHAT ARE MY OPTIONS FOR HRT? HRT may consist of the synthetic hormones estrogen and progestin, or it may consist of only estrogen (estrogen-only therapy). You and your health care provider will decide which form of HRT is best for you. If you choose to be on HRT and you have a uterus, estrogen and progestin are usually prescribed. Estrogen-only therapy is used for women who do not have a uterus. Possible options for taking HRT include:  Pills.  Patches.  Gels.  Sprays.  Vaginal cream.  Vaginal rings.  Vaginal inserts. The amount of hormone(s) that you take and how long you take the hormone(s) varies depending on your individual health. It is important to:  Begin HRT with the lowest possible dosage.  Stop HRT as soon as your health care provider tells you to stop.  Work with your health care provider so that you feel informed and comfortable with your decisions. WHAT ARE THE BENEFITS OF HRT? HRT can reduce the frequency and severity of menopausal symptoms. Benefits of HRT vary depending on the menopausal symptoms that you have, the severity of your symptoms, and your overall health. HRT may help to improve the following menopausal symptoms:  Hot flashes and night sweats. These are sudden feelings of heat that spread over the face and body. The skin may turn red, like a blush. Night sweats are hot flashes that happen while you are sleeping or trying to sleep.  Bone loss  (osteoporosis). The body loses calcium more quickly after menopause, causing the bones to become weaker. This can increase the risk for bone breaks (fractures).  Vaginal dryness. The lining of the vagina can become thin and dry, which can cause pain during sexual intercourse or cause infection, burning, or itching.  Urinary tract infections.  Urinary incontinence. This is a decreased ability to control when you urinate.  Irritability.  Short-term memory problems. WHAT ARE THE RISKS OF HRT? Risks of HRT vary depending on your individual health and medical history. Risks of HRT also depend on whether you receive both estrogen and progestin or you receive estrogen only.HRT may increase the risk of:  Spotting. This is when a small amount of bloodleaks from the vagina unexpectedly.  Endometrial cancer. This cancer is in the lining of the uterus (endometrium).  Breast cancer.  Increased density of breast tissue. This can make it harder to find breast cancer on a breast X-ray (mammogram).  Stroke.  Heart attack.  Blood clots.  Gallbladder disease. Risks of HRT can increase if you have any of the following conditions:  Endometrial cancer.  Liver disease.  Heart disease.  Breast cancer.  History of blood clots.  History of stroke. HOW SHOULD I CARE FOR MYSELF WHILE I AM ON HRT?  Take over-the-counter and prescription medicines only as told by your health care provider.  Get mammograms, pelvic exams, and medical checkups as often as told by your health care provider.  Have Pap tests done as often as told by your health care provider. A Pap test is sometimes called a Pap smear. It is a  screening test that is used to check for signs of cancer of the cervix and vagina. A Pap test can also identify the presence of infection or precancerous changes. Pap tests may be done:  Every 3 years, starting at age 107.  Every 5 years, starting after age 56, in combination with testing for  human papillomavirus (HPV).  More often or less often depending on other medical conditions you have, your age, and other risk factors.  It is your responsibility to get your Pap test results. Ask your health care provider or the department performing the test when your results will be ready.  Keep all follow-up visits as told by your health care provider. This is important. WHEN SHOULD I SEEK MEDICAL CARE? Talk with your health care provider if:  You have any of these:  Pain or swelling in your legs.  Shortness of breath.  Chest pain.  Lumps or changes in your breasts or armpits.  Slurred speech.  Pain, burning, or bleeding when you urine.  You develop any of these:  Unusual vaginal bleeding.  Dizziness or headaches.  Weakness or numbness in any part of your arms or legs.  Pain in your abdomen. This information is not intended to replace advice given to you by your health care provider. Make sure you discuss any questions you have with your health care provider. Document Released: 07/28/2003 Document Revised: 04/05/2016 Document Reviewed: 05/02/2015  2017 Elsevier Menopause Menopause is the normal time of life when menstrual periods stop completely. Menopause is complete when you have missed 12 consecutive menstrual periods. It usually occurs between the ages of 41 years and 39 years. Very rarely does a woman develop menopause before the age of 33 years. At menopause, your ovaries stop producing the female hormones estrogen and progesterone. This can cause undesirable symptoms and also affect your health. Sometimes the symptoms may occur 4-5 years before the menopause begins. There is no relationship between menopause and:  Oral contraceptives.  Number of children you had.  Race.  The age your menstrual periods started (menarche). Heavy smokers and very thin women may develop menopause earlier in life. What are the causes?  The ovaries stop producing the female  hormones estrogen and progesterone. Other causes include:  Surgery to remove both ovaries.  The ovaries stop functioning for no known reason.  Tumors of the pituitary gland in the brain.  Medical disease that affects the ovaries and hormone production.  Radiation treatment to the abdomen or pelvis.  Chemotherapy that affects the ovaries. What are the signs or symptoms?  Hot flashes.  Night sweats.  Decrease in sex drive.  Vaginal dryness and thinning of the vagina causing painful intercourse.  Dryness of the skin and developing wrinkles.  Headaches.  Tiredness.  Irritability.  Memory problems.  Weight gain.  Bladder infections.  Hair growth of the face and chest.  Infertility. More serious symptoms include:  Loss of bone (osteoporosis) causing breaks (fractures).  Depression.  Hardening and narrowing of the arteries (atherosclerosis) causing heart attacks and strokes. How is this diagnosed?  When the menstrual periods have stopped for 12 straight months.  Physical exam.  Hormone studies of the blood. How is this treated? There are many treatment choices and nearly as many questions about them. The decisions to treat or not to treat menopausal changes is an individual choice made with your health care provider. Your health care provider can discuss the treatments with you. Together, you can decide which treatment will work best  for you. Your treatment choices may include:  Hormone therapy (estrogen and progesterone).  Non-hormonal medicines.  Treating the individual symptoms with medicine (for example antidepressants for depression).  Herbal medicines that may help specific symptoms.  Counseling by a psychiatrist or psychologist.  Group therapy.  Lifestyle changes including:  Eating healthy.  Regular exercise.  Limiting caffeine and alcohol.  Stress management and meditation.  No treatment. Follow these instructions at home:  Take the  medicine your health care provider gives you as directed.  Get plenty of sleep and rest.  Exercise regularly.  Eat a diet that contains calcium (good for the bones) and soy products (acts like estrogen hormone).  Avoid alcoholic beverages.  Do not smoke.  If you have hot flashes, dress in layers.  Take supplements, calcium, and vitamin D to strengthen bones.  You can use over-the-counter lubricants or moisturizers for vaginal dryness.  Group therapy is sometimes very helpful.  Acupuncture may be helpful in some cases. Contact a health care provider if:  You are not sure you are in menopause.  You are having menopausal symptoms and need advice and treatment.  You are still having menstrual periods after age 10 years.  You have pain with intercourse.  Menopause is complete (no menstrual period for 12 months) and you develop vaginal bleeding.  You need a referral to a specialist (gynecologist, psychiatrist, or psychologist) for treatment. Get help right away if:  You have severe depression.  You have excessive vaginal bleeding.  You fell and think you have a broken bone.  You have pain when you urinate.  You develop leg or chest pain.  You have a fast pounding heart beat (palpitations).  You have severe headaches.  You develop vision problems.  You feel a lump in your breast.  You have abdominal pain or severe indigestion. This information is not intended to replace advice given to you by your health care provider. Make sure you discuss any questions you have with your health care provider. Document Released: 01/19/2004 Document Revised: 04/05/2016 Document Reviewed: 05/28/2013 Elsevier Interactive Patient Education  2017 [employer].

## 2017-01-31 ENCOUNTER — Other Ambulatory Visit: Payer: Self-pay | Admitting: Family Medicine

## 2017-01-31 DIAGNOSIS — I10 Essential (primary) hypertension: Secondary | ICD-10-CM

## 2017-03-14 ENCOUNTER — Ambulatory Visit (INDEPENDENT_AMBULATORY_CARE_PROVIDER_SITE_OTHER): Payer: BLUE CROSS/BLUE SHIELD | Admitting: Family Medicine

## 2017-03-14 ENCOUNTER — Encounter: Payer: Self-pay | Admitting: Family Medicine

## 2017-03-14 VITALS — BP 136/56 | HR 47 | Temp 98.3°F | Resp 16 | Ht 61.5 in | Wt 159.8 lb

## 2017-03-14 DIAGNOSIS — Z0000 Encounter for general adult medical examination without abnormal findings: Secondary | ICD-10-CM | POA: Diagnosis not present

## 2017-03-14 DIAGNOSIS — F419 Anxiety disorder, unspecified: Secondary | ICD-10-CM | POA: Diagnosis not present

## 2017-03-14 DIAGNOSIS — E785 Hyperlipidemia, unspecified: Secondary | ICD-10-CM

## 2017-03-14 DIAGNOSIS — I10 Essential (primary) hypertension: Secondary | ICD-10-CM

## 2017-03-14 LAB — POC URINALSYSI DIPSTICK (AUTOMATED)
Bilirubin, UA: NEGATIVE
Blood, UA: NEGATIVE
Glucose, UA: NEGATIVE
Leukocytes, UA: NEGATIVE
Nitrite, UA: NEGATIVE
Urobilinogen, UA: 0.2 E.U./dL
pH, UA: 6 (ref 5.0–8.0)

## 2017-03-14 MED ORDER — AMLODIPINE BESYLATE 5 MG PO TABS: 5.0000 mg | ORAL_TABLET | Freq: Every day | ORAL | 1 refills | Status: DC

## 2017-03-14 MED ORDER — METOPROLOL SUCCINATE ER 200 MG PO TB24: 200.0000 mg | ORAL_TABLET | Freq: Every day | ORAL | 1 refills | Status: DC

## 2017-03-14 NOTE — Progress Notes (Signed)
Pre visit review using our clinic review tool, if applicable. No additional management support is needed unless otherwise documented below in the visit note. 

## 2017-03-14 NOTE — Progress Notes (Signed)
Patient ID: Jodi Rodriguez, female   DOB: 25-Aug-1961, 56 y.o.   MRN: [id number]   Subjective:     Jodi Rodriguez is a 56 y.o. female and is here for a comprehensive physical exam. The patient reports no problems.  Social History   Social History  . Marital status: Married    Spouse name: N/A  . Number of children: N/A  . Years of education: N/A   Occupational History  . Insurance    Social History Main Topics  . Smoking status: Never Smoker  . Smokeless tobacco: Never Used  . Alcohol use 0.0 oz/week     Comment: occass one weekly  . Drug use: No  . Sexual activity: Yes    Partners: Male    Birth control/ protection: Other-see comments     Comment: vasectomy   Other Topics Concern  . Not on file   Social History Narrative   Exercise-- 3x a week -- walk 1 mile on treadmill or stationary bike   Health Maintenance  Topic Date Due  . COLONOSCOPY  08/10/2013  . MAMMOGRAM  01/25/2017  . INFLUENZA VACCINE  06/12/2017  . TETANUS/TDAP  05/24/2018  . PAP SMEAR  07/20/2019  . Hepatitis C Screening  Completed  . HIV Screening  Completed    The following portions of the patient's history were reviewed and updated as appropriate:  She  has a past medical history of Anxiety; Fibroid; Hyperlipidemia; and Hypertension. She  does not have any pertinent problems on file. She  has a past surgical history that includes Breast biopsy (1981). Her family history includes Alzheimer's disease in her mother; Diabetes in her father; Hypertension in her father, mother, and sister; Thyroid disease in her sister. She  reports that she has never smoked. She has never used smokeless tobacco. She reports that she drinks alcohol. She reports that she does not use drugs. She has a current medication list which includes the following prescription(s): amlodipine, ascorbic acid, aspirin, metoprolol, multivitamin, and fish oil. Current Outpatient Prescriptions on File Prior to Visit  Medication Sig  Dispense Refill  . Ascorbic Acid (VITAMIN C PO) Take by mouth daily.      Marland Kitchen aspirin 81 MG chewable tablet Chew 81 mg by mouth daily.      . Multiple Vitamin (MULTIVITAMIN) tablet Take 1 tablet by mouth daily.      . Omega-3 Fatty Acids (FISH OIL) 1000 MG CAPS Take 1 capsule by mouth daily.     No current facility-administered medications on file prior to visit.    She has No Known Allergies..  Review of Systems Review of Systems  Constitutional: Negative for activity change, appetite change and fatigue.  HENT: Negative for hearing loss, congestion, tinnitus and ear discharge.  dentist q64m Eyes: Negative for visual disturbance (see optho q1y -- vision corrected to 20/20 with glasses).  Respiratory: Negative for cough, chest tightness and shortness of breath.   Cardiovascular: Negative for chest pain, palpitations and leg swelling.  Gastrointestinal: Negative for abdominal pain, diarrhea, constipation and abdominal distention.  Genitourinary: Negative for urgency, frequency, decreased urine volume and difficulty urinating.  Musculoskeletal: Negative for back pain, arthralgias and gait problem.  Skin: Negative for color change, pallor and rash.  Neurological: Negative for dizziness, light-headedness, numbness and headaches.  Hematological: Negative for adenopathy. Does not bruise/bleed easily.  Psychiatric/Behavioral: Negative for suicidal ideas, confusion, sleep disturbance, self-injury, dysphoric mood, decreased concentration and agitation.       Objective:    BP Marland Kitchen)  136/56   Pulse (!) 47   Temp 98.3 F (36.8 C) (Oral)   Resp 16   Ht 5' 1.5" (1.562 m)   Wt 159 lb 12.8 oz (72.5 kg)   LMP 02/26/2014   SpO2 99%   BMI 29.70 kg/m  General appearance: alert, cooperative, appears stated age and no distress Head: Normocephalic, without obvious abnormality, atraumatic Eyes: conjunctivae/corneas clear. PERRL, EOM's intact. Fundi benign. Ears: normal TM's and external ear canals both  ears Nose: Nares normal. Septum midline. Mucosa normal. No drainage or sinus tenderness. Throat: lips, mucosa, and tongue normal; teeth and gums normal Neck: no adenopathy, no carotid bruit, no JVD, supple, symmetrical, trachea midline and thyroid not enlarged, symmetric, no tenderness/mass/nodules Back: symmetric, no curvature. ROM normal. No CVA tenderness. Lungs: clear to auscultation bilaterally Breasts: gyn Heart: regular rate and rhythm, S1, S2 normal, no murmur, click, rub or gallop Abdomen: soft, non-tender; bowel sounds normal; no masses,  no organomegaly Pelvic: deferred--gyn Extremities: extremities normal, atraumatic, no cyanosis or edema Pulses: 2+ and symmetric Skin: Skin color, texture, turgor normal. No rashes or lesions Lymph nodes: Cervical, supraclavicular, and axillary nodes normal. Neurologic: Alert and oriented X 3, normal strength and tone. Normal symmetric reflexes. Normal coordination and gait    Assessment:    Healthy female exam.      Plan:    ghm utd --pt refused shingrix Check labs See After Visit Summary for Counseling Recommendations    1. Essential hypertension Well controlled, no changes to meds. Encouraged heart healthy diet such as the DASH diet and exercise as tolerated.   - amLODipine (NORVASC) 5 MG tablet; Take 1 tablet (5 mg total) by mouth daily.  Dispense: 90 tablet; Refill: 1 - metoprolol (TOPROL-XL) 200 MG 24 hr tablet; Take 1 tablet (200 mg total) by mouth daily.  Dispense: 90 tablet; Refill: 1  2. Hyperlipidemia, unspecified hyperlipidemia type  - POCT Urinalysis Dipstick (Automated) - CBC - Comprehensive metabolic panel - Lipid panel - TSH  3. Anxiety   4. Preventative health care See above - POCT Urinalysis Dipstick (Automated) - CBC - Comprehensive metabolic panel - Lipid panel - TSH - Ambulatory referral to Gastroenterology

## 2017-03-14 NOTE — Patient Instructions (Signed)

## 2017-03-15 LAB — COMPREHENSIVE METABOLIC PANEL
ALBUMIN: 4.6 g/dL (ref 3.5–5.2)
ALK PHOS: 58 U/L (ref 39–117)
ALT: 19 U/L (ref 0–35)
AST: 29 U/L (ref 0–37)
BUN: 17 mg/dL (ref 6–23)
CO2: 27 mEq/L (ref 19–32)
Calcium: 9.8 mg/dL (ref 8.4–10.5)
Chloride: 103 mEq/L (ref 96–112)
Creatinine, Ser: 0.93 mg/dL (ref 0.40–1.20)
GFR: 80.92 mL/min (ref >60.00)
GLUCOSE: 76 mg/dL (ref 70–99)
POTASSIUM: 4.1 meq/L (ref 3.5–5.1)
Sodium: 138 mEq/L (ref 135–145)
TOTAL PROTEIN: 7.8 g/dL (ref 6.0–8.3)
Total Bilirubin: 0.8 mg/dL (ref 0.2–1.2)

## 2017-03-15 LAB — LIPID PANEL
CHOL/HDL RATIO: 3
Cholesterol: 241 mg/dL — ABNORMAL HIGH (ref 0–200)
HDL: 81.3 mg/dL (ref >39.00)
LDL Cholesterol: 152 mg/dL — ABNORMAL HIGH (ref 0–99)
NONHDL: 159.31
Triglycerides: 39 mg/dL (ref 0.0–149.0)
VLDL: 7.8 mg/dL (ref 0.0–40.0)

## 2017-03-15 LAB — CBC
HEMATOCRIT: 39.9 % (ref 36.0–46.0)
HEMOGLOBIN: 13.4 g/dL (ref 12.0–15.0)
MCHC: 33.5 g/dL (ref 30.0–36.0)
MCV: 87.8 fl (ref 78.0–100.0)
Platelets: 217 10*3/uL (ref 150.0–400.0)
RBC: 4.55 Mil/uL (ref 3.87–5.11)
RDW: 13.9 % (ref 11.5–15.5)
WBC: 5.1 10*3/uL (ref 4.0–10.5)

## 2017-03-15 LAB — TSH: TSH: 1.05 u[IU]/mL (ref 0.35–4.50)

## 2017-03-18 ENCOUNTER — Other Ambulatory Visit: Payer: Self-pay | Admitting: Family Medicine

## 2017-03-18 DIAGNOSIS — E785 Hyperlipidemia, unspecified: Secondary | ICD-10-CM

## 2017-03-26 ENCOUNTER — Telehealth: Payer: Self-pay | Admitting: Family Medicine

## 2017-03-26 DIAGNOSIS — I10 Essential (primary) hypertension: Secondary | ICD-10-CM

## 2017-03-26 MED ORDER — METOPROLOL SUCCINATE ER 200 MG PO TB24: 200.0000 mg | ORAL_TABLET | Freq: Every day | ORAL | 3 refills | Status: DC

## 2017-03-26 NOTE — Telephone Encounter (Signed)
Caller name: Relationship to patient: Self Can be reached:[phone]  Pharmacy:  [facility], [place] to [facility] [phone] (Phone) [phone] (Fax)     Reason for call: Request to have Rx for metoprolol (TOPROL-XL) 200 MG 24 hr tablet [[id number]

## 2017-03-26 NOTE — Telephone Encounter (Signed)
Refill done Patient notified. 

## 2017-03-27 ENCOUNTER — Encounter: Payer: Self-pay | Admitting: Gynecology

## 2017-05-08 LAB — HM COLONOSCOPY

## 2017-05-28 ENCOUNTER — Encounter: Payer: Self-pay | Admitting: *Deleted

## 2017-07-23 ENCOUNTER — Ambulatory Visit (INDEPENDENT_AMBULATORY_CARE_PROVIDER_SITE_OTHER): Payer: BLUE CROSS/BLUE SHIELD | Admitting: Women's Health

## 2017-07-23 ENCOUNTER — Encounter: Payer: Self-pay | Admitting: Women's Health

## 2017-07-23 VITALS — BP 132/84 | Ht 62.0 in | Wt 165.6 lb

## 2017-07-23 DIAGNOSIS — Z01419 Encounter for gynecological examination (general) (routine) without abnormal findings: Secondary | ICD-10-CM | POA: Diagnosis not present

## 2017-07-23 NOTE — Patient Instructions (Signed)
Health Maintenance for Postmenopausal Women Menopause is a normal process in which your reproductive ability comes to an end. This process happens gradually over a span of months to years, usually between the ages of 83 and 41. Menopause is complete when you have missed 12 consecutive menstrual periods. It is important to talk with your health care provider about some of the most common conditions that affect postmenopausal women, such as heart disease, cancer, and bone loss (osteoporosis). Adopting a healthy lifestyle and getting preventive care can help to promote your health and wellness. Those actions can also lower your chances of developing some of these common conditions. What should I know about menopause? During menopause, you may experience a number of symptoms, such as:  Moderate-to-severe hot flashes.  Night sweats.  Decrease in sex drive.  Mood swings.  Headaches.  Tiredness.  Irritability.  Memory problems.  Insomnia.  Choosing to treat or not to treat menopausal changes is an individual decision that you make with your health care provider. What should I know about hormone replacement therapy and supplements? Hormone therapy products are effective for treating symptoms that are associated with menopause, such as hot flashes and night sweats. Hormone replacement carries certain risks, especially as you become older. If you are thinking about using estrogen or estrogen with progestin treatments, discuss the benefits and risks with your health care provider. What should I know about heart disease and stroke? Heart disease, heart attack, and stroke become more likely as you age. This may be due, in part, to the hormonal changes that your body experiences during menopause. These can affect how your body processes dietary fats, triglycerides, and cholesterol. Heart attack and stroke are both medical emergencies. There are many things that you can do to help prevent heart  disease and stroke:  Have your blood pressure checked at least every 1-2 years. High blood pressure causes heart disease and increases the risk of stroke.  If you are 12-82 years old, ask your health care provider if you should take aspirin to prevent a heart attack or a stroke.  Do not use any tobacco products, including cigarettes, chewing tobacco, or electronic cigarettes. If you need help quitting, ask your health care provider.  It is important to eat a healthy diet and maintain a healthy weight. ? Be sure to include plenty of vegetables, fruits, low-fat dairy products, and lean protein. ? Avoid eating foods that are high in solid fats, added sugars, or salt (sodium).  Get regular exercise. This is one of the most important things that you can do for your health. ? Try to exercise for at least 150 minutes each week. The type of exercise that you do should increase your heart rate and make you sweat. This is known as moderate-intensity exercise. ? Try to do strengthening exercises at least twice each week. Do these in addition to the moderate-intensity exercise.  Know your numbers.Ask your health care provider to check your cholesterol and your blood glucose. Continue to have your blood tested as directed by your health care provider.  What should I know about cancer screening? There are several types of cancer. Take the following steps to reduce your risk and to catch any cancer development as early as possible. Breast Cancer  Practice breast self-awareness. ? This means understanding how your breasts normally appear and feel. ? It also means doing regular breast self-exams. Let your health care provider know about any changes, no matter how small.  If you are 40  or older, have a clinician do a breast exam (clinical breast exam or CBE) every year. Depending on your age, family history, and medical history, it may be recommended that you also have a yearly breast X-ray  (mammogram).  If you have a family history of breast cancer, talk with your health care provider about genetic screening.  If you are at high risk for breast cancer, talk with your health care provider about having an MRI and a mammogram every year.  Breast cancer (BRCA) gene test is recommended for women who have family members with BRCA-related cancers. Results of the assessment will determine the need for genetic counseling and BRCA1 and for BRCA2 testing. BRCA-related cancers include these types: ? Breast. This occurs in males or females. ? Ovarian. ? Tubal. This may also be called fallopian tube cancer. ? Cancer of the abdominal or pelvic lining (peritoneal cancer). ? Prostate. ? Pancreatic.  Cervical, Uterine, and Ovarian Cancer Your health care provider may recommend that you be screened regularly for cancer of the pelvic organs. These include your ovaries, uterus, and vagina. This screening involves a pelvic exam, which includes checking for microscopic changes to the surface of your cervix (Pap test).  For women ages 21-65, health care providers may recommend a pelvic exam and a Pap test every three years. For women ages 72-65, they may recommend the Pap test and pelvic exam, combined with testing for human papilloma virus (HPV), every five years. Some types of HPV increase your risk of cervical cancer. Testing for HPV may also be done on women of any age who have unclear Pap test results.  Other health care providers may not recommend any screening for nonpregnant women who are considered low risk for pelvic cancer and have no symptoms. Ask your health care provider if a screening pelvic exam is right for you.  If you have had past treatment for cervical cancer or a condition that could lead to cancer, you need Pap tests and screening for cancer for at least 20 years after your treatment. If Pap tests have been discontinued for you, your risk factors (such as having a new sexual  partner) need to be reassessed to determine if you should start having screenings again. Some women have medical problems that increase the chance of getting cervical cancer. In these cases, your health care provider may recommend that you have screening and Pap tests more often.  If you have a family history of uterine cancer or ovarian cancer, talk with your health care provider about genetic screening.  If you have vaginal bleeding after reaching menopause, tell your health care provider.  There are currently no reliable tests available to screen for ovarian cancer.  Lung Cancer Lung cancer screening is recommended for adults 65-82 years old who are at high risk for lung cancer because of a history of smoking. A yearly low-dose CT scan of the lungs is recommended if you:  Currently smoke.  Have a history of at least 30 pack-years of smoking and you currently smoke or have quit within the past 15 years. A pack-year is smoking an average of one pack of cigarettes per day for one year.  Yearly screening should:  Continue until it has been 15 years since you quit.  Stop if you develop a health problem that would prevent you from having lung cancer treatment.  Colorectal Cancer  This type of cancer can be detected and can often be prevented.  Routine colorectal cancer screening usually begins at  age 30 and continues through age 22.  If you have risk factors for colon cancer, your health care provider may recommend that you be screened at an earlier age.  If you have a family history of colorectal cancer, talk with your health care provider about genetic screening.  Your health care provider may also recommend using home test kits to check for hidden blood in your stool.  A small camera at the end of a tube can be used to examine your colon directly (sigmoidoscopy or colonoscopy). This is done to check for the earliest forms of colorectal cancer.  Direct examination of the colon  should be repeated every 5-10 years until age 28. However, if early forms of precancerous polyps or small growths are found or if you have a family history or genetic risk for colorectal cancer, you may need to be screened more often.  Skin Cancer  Check your skin from head to toe regularly.  Monitor any moles. Be sure to tell your health care provider: ? About any new moles or changes in moles, especially if there is a change in a mole's shape or color. ? If you have a mole that is larger than the size of a pencil eraser.  If any of your family members has a history of skin cancer, especially at a young age, talk with your health care provider about genetic screening.  Always use sunscreen. Apply sunscreen liberally and repeatedly throughout the day.  Whenever you are outside, protect yourself by wearing long sleeves, pants, a wide-brimmed hat, and sunglasses.  What should I know about osteoporosis? Osteoporosis is a condition in which bone destruction happens more quickly than new bone creation. After menopause, you may be at an increased risk for osteoporosis. To help prevent osteoporosis or the bone fractures that can happen because of osteoporosis, the following is recommended:  If you are 62-69 years old, get at least 1,000 mg of calcium and at least 600 mg of vitamin D per day.  If you are older than age 60 but younger than age 68, get at least 1,200 mg of calcium and at least 600 mg of vitamin D per day.  If you are older than age 35, get at least 1,200 mg of calcium and at least 800 mg of vitamin D per day.  Smoking and excessive alcohol intake increase the risk of osteoporosis. Eat foods that are rich in calcium and vitamin D, and do weight-bearing exercises several times each week as directed by your health care provider. What should I know about how menopause affects my mental health? Depression may occur at any age, but it is more common as you become older. Common symptoms of  depression include:  Low or sad mood.  Changes in sleep patterns.  Changes in appetite or eating patterns.  Feeling an overall lack of motivation or enjoyment of activities that you previously enjoyed.  Frequent crying spells.  Talk with your health care provider if you think that you are experiencing depression. What should I know about immunizations? It is important that you get and maintain your immunizations. These include:  Tetanus, diphtheria, and pertussis (Tdap) booster vaccine.  Influenza every year before the flu season begins.  Pneumonia vaccine.  Shingles vaccine.  Your health care provider may also recommend other immunizations. This information is not intended to replace advice given to you by your health care provider. Make sure you discuss any questions you have with your health care provider. Document Released: 12/21/2005  Document Revised: 05/18/2016 Document Reviewed: 08/02/2015 Elsevier Interactive Patient Education  2018 [employer]  

## 2017-07-23 NOTE — Progress Notes (Signed)
LIMA CHILLEMI [birth year] [id number]    History:    Presents for annual exam.  Postmenopausal on no HRT with no bleeding. Normal Pap and mammogram history. Negative colonoscopy 2018. Had irregular bleeding negative sonohysterogram 09/2016. Negative follow-up ultrasound for a small ovarian cyst -12/2016. Primary care manages hypertension. Had lost 30 pounds/2 year with diet and exercise has regained 20.  Past medical history, past surgical history, family history and social history were all reviewed and documented in the EPIC chart. Self-employed. Daughter in graduate school in [place], son working in [place]. Parents hypertension, mother Alzheimer's, father diabetes.  ROS:  A ROS was performed and pertinent positives and negatives are included.  Exam:  Vitals:   07/23/17 1101  BP: 132/84  Weight: 165 lb 9.6 oz (75.1 kg)  Height: 5\' 2"  (1.575 m)   Body mass index is 30.29 kg/m.   General appearance:  Normal Thyroid:  Symmetrical, normal in size, without palpable masses or nodularity. Respiratory  Auscultation:  Clear without wheezing or rhonchi Cardiovascular  Auscultation:  Regular rate, without rubs, murmurs or gallops  Edema/varicosities:  Not grossly evident Abdominal  Soft,nontender, without masses, guarding or rebound.  Liver/spleen:  No organomegaly noted  Hernia:  None appreciated  Skin  Inspection:  Grossly normal   Breasts: Examined lying and sitting.     Right: Without masses, retractions, discharge or axillary adenopathy.     Left: Without masses, retractions, discharge or axillary adenopathy. Gentitourinary   Inguinal/mons:  Normal without inguinal adenopathy  External genitalia:  Normal  BUS/Urethra/Skene's glands:  Normal  Vagina:  Normal  Cervix:  Normal  Uterus:   normal in size, shape and contour.  Midline and mobile  Adnexa/parametria:     Rt: Without masses or tenderness.   Lt: Without masses or tenderness.  Anus and perineum: Normal  Digital  rectal exam: Normal sphincter tone without palpated masses or tenderness  Assessment/Plan:  56 y.o. MBF G2 P2 for annual exam with no complaints.  Postmenopausal/no HRT/no bleeding Hypertension-primary care manages labs and meds  Plan: SBE's, annual screening mammogram, overdue instructed to schedule. Reviewed importance of increasing exercise and decreasing calories for weight loss, calcium rich diet, vitamin D 2000 daily encouraged. Pap normal with negative HR HPV 2017, new screening guidelines reviewed.Huel Cote [facility], 11:34 AM 07/23/2017

## 2017-09-03 ENCOUNTER — Other Ambulatory Visit: Payer: Self-pay | Admitting: Family Medicine

## 2017-09-03 DIAGNOSIS — I10 Essential (primary) hypertension: Secondary | ICD-10-CM

## 2017-09-03 NOTE — Telephone Encounter (Signed)
Requested drug refills are authorized only given #90, however, the patient needs further evaluation and/or laboratory testing before further refills are given. Ask her to make an appointment for this.//AB/CMA

## 2017-09-18 ENCOUNTER — Other Ambulatory Visit (INDEPENDENT_AMBULATORY_CARE_PROVIDER_SITE_OTHER): Payer: BLUE CROSS/BLUE SHIELD

## 2017-09-18 DIAGNOSIS — E785 Hyperlipidemia, unspecified: Secondary | ICD-10-CM

## 2017-09-18 LAB — LIPID PANEL
CHOLESTEROL: 232 mg/dL — AB (ref 0–200)
HDL: 79.7 mg/dL (ref >39.00)
LDL Cholesterol: 144 mg/dL — ABNORMAL HIGH (ref 0–99)
NonHDL: 152.73
TRIGLYCERIDES: 44 mg/dL (ref 0.0–149.0)
Total CHOL/HDL Ratio: 3
VLDL: 8.8 mg/dL (ref 0.0–40.0)

## 2017-09-18 LAB — COMPREHENSIVE METABOLIC PANEL
ALBUMIN: 4.2 g/dL (ref 3.5–5.2)
ALT: 16 U/L (ref 0–35)
AST: 20 U/L (ref 0–37)
Alkaline Phosphatase: 52 U/L (ref 39–117)
BILIRUBIN TOTAL: 0.5 mg/dL (ref 0.2–1.2)
BUN: 16 mg/dL (ref 6–23)
CALCIUM: 9.6 mg/dL (ref 8.4–10.5)
CO2: 32 mEq/L (ref 19–32)
CREATININE: 0.84 mg/dL (ref 0.40–1.20)
Chloride: 103 mEq/L (ref 96–112)
GFR: 90.83 mL/min (ref >60.00)
Glucose, Bld: 102 mg/dL — ABNORMAL HIGH (ref 70–99)
Potassium: 3.9 mEq/L (ref 3.5–5.1)
Sodium: 140 mEq/L (ref 135–145)
TOTAL PROTEIN: 7.4 g/dL (ref 6.0–8.3)

## 2017-09-23 ENCOUNTER — Other Ambulatory Visit: Payer: Self-pay | Admitting: Family Medicine

## 2017-09-23 DIAGNOSIS — E1169 Type 2 diabetes mellitus with other specified complication: Secondary | ICD-10-CM

## 2017-09-23 DIAGNOSIS — E785 Hyperlipidemia, unspecified: Secondary | ICD-10-CM

## 2017-12-07 ENCOUNTER — Other Ambulatory Visit: Payer: Self-pay | Admitting: Family Medicine

## 2017-12-07 DIAGNOSIS — I10 Essential (primary) hypertension: Secondary | ICD-10-CM

## 2017-12-23 DIAGNOSIS — H2513 Age-related nuclear cataract, bilateral: Secondary | ICD-10-CM | POA: Diagnosis not present

## 2018-03-11 ENCOUNTER — Other Ambulatory Visit: Payer: Self-pay | Admitting: Family Medicine

## 2018-03-11 DIAGNOSIS — I10 Essential (primary) hypertension: Secondary | ICD-10-CM

## 2018-03-25 ENCOUNTER — Other Ambulatory Visit: Payer: BLUE CROSS/BLUE SHIELD

## 2018-05-06 ENCOUNTER — Other Ambulatory Visit: Payer: Self-pay | Admitting: Family Medicine

## 2018-05-06 DIAGNOSIS — I10 Essential (primary) hypertension: Secondary | ICD-10-CM

## 2018-06-05 ENCOUNTER — Encounter: Payer: Self-pay | Admitting: Family Medicine

## 2018-06-05 ENCOUNTER — Ambulatory Visit (INDEPENDENT_AMBULATORY_CARE_PROVIDER_SITE_OTHER): Payer: BLUE CROSS/BLUE SHIELD | Admitting: Family Medicine

## 2018-06-05 ENCOUNTER — Encounter

## 2018-06-05 VITALS — BP 134/65 | HR 55 | Temp 98.7°F | Resp 16 | Ht 62.0 in | Wt 155.6 lb

## 2018-06-05 DIAGNOSIS — Z0000 Encounter for general adult medical examination without abnormal findings: Secondary | ICD-10-CM

## 2018-06-05 DIAGNOSIS — I10 Essential (primary) hypertension: Secondary | ICD-10-CM | POA: Diagnosis not present

## 2018-06-05 LAB — COMPREHENSIVE METABOLIC PANEL
ALBUMIN: 4.5 g/dL (ref 3.5–5.2)
ALT: 12 U/L (ref 0–35)
AST: 17 U/L (ref 0–37)
Alkaline Phosphatase: 56 U/L (ref 39–117)
BUN: 19 mg/dL (ref 6–23)
CHLORIDE: 105 meq/L (ref 96–112)
CO2: 28 meq/L (ref 19–32)
Calcium: 9.6 mg/dL (ref 8.4–10.5)
Creatinine, Ser: 0.82 mg/dL (ref 0.40–1.20)
GFR: 93.14 mL/min (ref >60.00)
Glucose, Bld: 95 mg/dL (ref 70–99)
POTASSIUM: 4 meq/L (ref 3.5–5.1)
SODIUM: 140 meq/L (ref 135–145)
Total Bilirubin: 0.6 mg/dL (ref 0.2–1.2)
Total Protein: 7.8 g/dL (ref 6.0–8.3)

## 2018-06-05 LAB — CBC WITH DIFFERENTIAL/PLATELET
BASOS PCT: 0.4 % (ref 0.0–3.0)
Basophils Absolute: 0 10*3/uL (ref 0.0–0.1)
EOS ABS: 0.1 10*3/uL (ref 0.0–0.7)
EOS PCT: 1.9 % (ref 0.0–5.0)
HCT: 39.7 % (ref 36.0–46.0)
HEMOGLOBIN: 13.1 g/dL (ref 12.0–15.0)
LYMPHS ABS: 2.4 10*3/uL (ref 0.7–4.0)
Lymphocytes Relative: 51.2 % — ABNORMAL HIGH (ref 12.0–46.0)
MCHC: 33.1 g/dL (ref 30.0–36.0)
MCV: 87 fl (ref 78.0–100.0)
MONO ABS: 0.3 10*3/uL (ref 0.1–1.0)
Monocytes Relative: 6.3 % (ref 3.0–12.0)
NEUTROS ABS: 1.9 10*3/uL (ref 1.4–7.7)
Neutrophils Relative %: 40.2 % — ABNORMAL LOW (ref 43.0–77.0)
PLATELETS: 212 10*3/uL (ref 150.0–400.0)
RBC: 4.56 Mil/uL (ref 3.87–5.11)
RDW: 14.5 % (ref 11.5–15.5)
WBC: 4.7 10*3/uL (ref 4.0–10.5)

## 2018-06-05 LAB — LIPID PANEL
CHOL/HDL RATIO: 3
CHOLESTEROL: 232 mg/dL — AB (ref 0–200)
HDL: 78.7 mg/dL (ref >39.00)
LDL CALC: 147 mg/dL — AB (ref 0–99)
NonHDL: 153.73
TRIGLYCERIDES: 35 mg/dL (ref 0.0–149.0)
VLDL: 7 mg/dL (ref 0.0–40.0)

## 2018-06-05 LAB — POC URINALSYSI DIPSTICK (AUTOMATED)
BILIRUBIN UA: NEGATIVE
Blood, UA: NEGATIVE
Glucose, UA: NEGATIVE
KETONES UA: NEGATIVE
LEUKOCYTES UA: NEGATIVE
Nitrite, UA: NEGATIVE
Protein, UA: POSITIVE — AB
Urobilinogen, UA: 0.2 E.U./dL
pH, UA: 5.5 (ref 5.0–8.0)

## 2018-06-05 LAB — TSH: TSH: 1.73 u[IU]/mL (ref 0.35–4.50)

## 2018-06-05 NOTE — Progress Notes (Signed)
Subjective:     Jodi Rodriguez is a 57 y.o. female and is here for a comprehensive physical exam. The patient reports no problems.  Social History   Socioeconomic History  . Marital status: Married    Spouse name: Not on file  . Number of children: Not on file  . Years of education: Not on file  . Highest education level: Not on file  Occupational History  . Occupation: Science writer  . Financial resource strain: Not on file  . Food insecurity:    Worry: Not on file    Inability: Not on file  . Transportation needs:    Medical: Not on file    Non-medical: Not on file  Tobacco Use  . Smoking status: Never Smoker  . Smokeless tobacco: Never Used  Substance and Sexual Activity  . Alcohol use: Yes    Alcohol/week: 0.0 oz    Comment: occass one weekly  . Drug use: No  . Sexual activity: Yes    Partners: Male    Birth control/protection: Other-see comments    Comment: vasectomy  Lifestyle  . Physical activity:    Days per week: Not on file    Minutes per session: Not on file  . Stress: Not on file  Relationships  . Social connections:    Talks on phone: Not on file    Gets together: Not on file    Attends religious service: Not on file    Active member of club or organization: Not on file    Attends meetings of clubs or organizations: Not on file    Relationship status: Not on file  . Intimate partner violence:    Fear of current or ex partner: Not on file    Emotionally abused: Not on file    Physically abused: Not on file    Forced sexual activity: Not on file  Other Topics Concern  . Not on file  Social History Narrative   Exercise-- 3x a week -- walk 1 mile on treadmill or stationary bike   Health Maintenance  Topic Date Due  . URINE MICROALBUMIN  07/28/2014  . MAMMOGRAM  01/25/2017  . TETANUS/TDAP  06/06/2019 (Originally 05/24/2018)  . INFLUENZA VACCINE  06/12/2018  . PAP SMEAR  07/20/2019  . COLONOSCOPY  05/09/2027  . Hepatitis C Screening   Completed  . HIV Screening  Completed    The following portions of the patient's history were reviewed and updated as appropriate:  She  has a past medical history of Anxiety, Fibroid, Hyperlipidemia, and Hypertension. She does not have any pertinent problems on file. She  has a past surgical history that includes Breast biopsy (1981). Her family history includes Alzheimer's disease in her mother and unknown relative; Diabetes in her father; Hypertension in her father, mother, and sister; Thyroid disease in her sister. She  reports that she has never smoked. She has never used smokeless tobacco. She reports that she drinks alcohol. She reports that she does not use drugs. She has a current medication list which includes the following prescription(s): amlodipine, ascorbic acid, aspirin, metoprolol, multivitamin, and fish oil. Current Outpatient Medications on File Prior to Visit  Medication Sig Dispense Refill  . amLODipine (NORVASC) 5 MG tablet TAKE 1 TABLET BY MOUTH EVERY DAY. NO MORE REFILLS UNTIL APPT 90 tablet 0  . Ascorbic Acid (VITAMIN C PO) Take by mouth daily.      Marland Kitchen aspirin 81 MG chewable tablet Chew 81 mg by mouth daily.      Marland Kitchen  metoprolol (TOPROL-XL) 200 MG 24 hr tablet TAKE 1 TABLET DAILY 90 tablet 3  . Multiple Vitamin (MULTIVITAMIN) tablet Take 1 tablet by mouth daily.      . Omega-3 Fatty Acids (FISH OIL) 1000 MG CAPS Take 1 capsule by mouth daily.     No current facility-administered medications on file prior to visit.    She has No Known Allergies..  Review of Systems Review of Systems  Constitutional: Negative for activity change, appetite change and fatigue.  HENT: Negative for hearing loss, congestion, tinnitus and ear discharge.  dentist q69m Eyes: Negative for visual disturbance (see optho q1y -- vision corrected to 20/20 with glasses).  Respiratory: Negative for cough, chest tightness and shortness of breath.   Cardiovascular: Negative for chest pain, palpitations  and leg swelling.  Gastrointestinal: Negative for abdominal pain, diarrhea, constipation and abdominal distention.  Genitourinary: Negative for urgency, frequency, decreased urine volume and difficulty urinating.  Musculoskeletal: Negative for back pain, arthralgias and gait problem.  Skin: Negative for color change, pallor and rash.  Neurological: Negative for dizziness, light-headedness, numbness and headaches.  Hematological: Negative for adenopathy. Does not bruise/bleed easily.  Psychiatric/Behavioral: Negative for suicidal ideas, confusion, sleep disturbance, self-injury, dysphoric mood, decreased concentration and agitation.       Objective:    BP 134/65 (BP Location: Left Arm, Cuff Size: Normal)   Pulse (!) 55   Temp 98.7 F (37.1 C) (Oral)   Resp 16   Ht 5\' 2"  (1.575 m)   Wt 155 lb 9.6 oz (70.6 kg)   LMP 02/26/2014   SpO2 100%   BMI 28.46 kg/m  General appearance: alert, cooperative, appears stated age and no distress Head: Normocephalic, without obvious abnormality, atraumatic Eyes: conjunctivae/corneas clear. PERRL, EOM's intact. Fundi benign. Ears: normal TM's and external ear canals both ears Nose: Nares normal. Septum midline. Mucosa normal. No drainage or sinus tenderness. Throat: lips, mucosa, and tongue normal; teeth and gums normal Neck: no adenopathy, no carotid bruit, no JVD, supple, symmetrical, trachea midline and thyroid not enlarged, symmetric, no tenderness/mass/nodules Back: symmetric, no curvature. ROM normal. No CVA tenderness. Lungs: clear to auscultation bilaterally Breasts: gyn Heart: regular rate and rhythm, S1, S2 normal, no murmur, click, rub or gallop Abdomen: soft, non-tender; bowel sounds normal; no masses,  no organomegaly Pelvic: deferred Extremities: extremities normal, atraumatic, no cyanosis or edema Pulses: 2+ and symmetric Skin: Skin color, texture, turgor normal. No rashes or lesions Lymph nodes: Cervical, supraclavicular, and  axillary nodes normal. Neurologic: Alert and oriented X 3, normal strength and tone. Normal symmetric reflexes. Normal coordination and gait    Assessment:    Healthy female exam.      Plan:     ghm utd Check labs  See After Visit Summary for Counseling Recommendations    1. Preventative health care ghm utd See above - CBC with Differential/Platelet - Comprehensive metabolic panel - Lipid panel - TSH - POCT Urinalysis Dipstick (Automated)  2. Essential hypertension Well controlled, no changes to meds. Encouraged heart healthy diet such as the DASH diet and exercise as tolerated.  - CBC with Differential/Platelet - Comprehensive metabolic panel - Lipid panel - TSH - POCT Urinalysis Dipstick (Automated)

## 2018-06-05 NOTE — Patient Instructions (Signed)
Preventive Care 40-64 Years, Female Preventive care refers to lifestyle choices and visits with your health care provider that can promote health and wellness. What does preventive care include?  A yearly physical exam. This is also called an annual well check.  Dental exams once or twice a year.  Routine eye exams. Ask your health care provider how often you should have your eyes checked.  Personal lifestyle choices, including: ? Daily care of your teeth and gums. ? Regular physical activity. ? Eating a healthy diet. ? Avoiding tobacco and drug use. ? Limiting alcohol use. ? Practicing safe sex. ? Taking low-dose aspirin daily starting at age 58. ? Taking vitamin and mineral supplements as recommended by your health care provider. What happens during an annual well check? The services and screenings done by your health care provider during your annual well check will depend on your age, overall health, lifestyle risk factors, and family history of disease. Counseling Your health care provider may ask you questions about your:  Alcohol use.  Tobacco use.  Drug use.  Emotional well-being.  Home and relationship well-being.  Sexual activity.  Eating habits.  Work and work Statistician.  Method of birth control.  Menstrual cycle.  Pregnancy history.  Screening You may have the following tests or measurements:  Height, weight, and BMI.  Blood pressure.  Lipid and cholesterol levels. These may be checked every 5 years, or more frequently if you are over 81 years old.  Skin check.  Lung cancer screening. You may have this screening every year starting at age 78 if you have a 30-pack-year history of smoking and currently smoke or have quit within the past 15 years.  Fecal occult blood test (FOBT) of the stool. You may have this test every year starting at age 65.  Flexible sigmoidoscopy or colonoscopy. You may have a sigmoidoscopy every 5 years or a colonoscopy  every 10 years starting at age 30.  Hepatitis C blood test.  Hepatitis B blood test.  Sexually transmitted disease (STD) testing.  Diabetes screening. This is done by checking your blood sugar (glucose) after you have not eaten for a while (fasting). You may have this done every 1-3 years.  Mammogram. This may be done every 1-2 years. Talk to your health care provider about when you should start having regular mammograms. This may depend on whether you have a family history of breast cancer.  BRCA-related cancer screening. This may be done if you have a family history of breast, ovarian, tubal, or peritoneal cancers.  Pelvic exam and Pap test. This may be done every 3 years starting at age 80. Starting at age 36, this may be done every 5 years if you have a Pap test in combination with an HPV test.  Bone density scan. This is done to screen for osteoporosis. You may have this scan if you are at high risk for osteoporosis.  Discuss your test results, treatment options, and if necessary, the need for more tests with your health care provider. Vaccines Your health care provider may recommend certain vaccines, such as:  Influenza vaccine. This is recommended every year.  Tetanus, diphtheria, and acellular pertussis (Tdap, Td) vaccine. You may need a Td booster every 10 years.  Varicella vaccine. You may need this if you have not been vaccinated.  Zoster vaccine. You may need this after age 5.  Measles, mumps, and rubella (MMR) vaccine. You may need at least one dose of MMR if you were born in  1955 or later. You may also need a second dose.  Pneumococcal 13-valent conjugate (PCV13) vaccine. You may need this if you have certain conditions and were not previously vaccinated.  Pneumococcal polysaccharide (PPSV23) vaccine. You may need one or two doses if you smoke cigarettes or if you have certain conditions.  Meningococcal vaccine. You may need this if you have certain  conditions.  Hepatitis A vaccine. You may need this if you have certain conditions or if you travel or work in places where you may be exposed to hepatitis A.  Hepatitis B vaccine. You may need this if you have certain conditions or if you travel or work in places where you may be exposed to hepatitis B.  Haemophilus influenzae type b (Hib) vaccine. You may need this if you have certain conditions.  Talk to your health care provider about which screenings and vaccines you need and how often you need them. This information is not intended to replace advice given to you by your health care provider. Make sure you discuss any questions you have with your health care provider. Document Released: 11/25/2015 Document Revised: 07/18/2016 Document Reviewed: 08/30/2015 Elsevier Interactive Patient Education  2018 [employer]  

## 2018-06-10 ENCOUNTER — Other Ambulatory Visit: Payer: Self-pay | Admitting: Family Medicine

## 2018-06-10 DIAGNOSIS — I10 Essential (primary) hypertension: Secondary | ICD-10-CM

## 2018-07-29 ENCOUNTER — Ambulatory Visit (INDEPENDENT_AMBULATORY_CARE_PROVIDER_SITE_OTHER): Payer: BLUE CROSS/BLUE SHIELD | Admitting: Women's Health

## 2018-07-29 ENCOUNTER — Encounter: Payer: Self-pay | Admitting: Women's Health

## 2018-07-29 VITALS — BP 128/80 | Ht 61.0 in | Wt 159.8 lb

## 2018-07-29 DIAGNOSIS — Z13820 Encounter for screening for osteoporosis: Secondary | ICD-10-CM

## 2018-07-29 DIAGNOSIS — Z01419 Encounter for gynecological examination (general) (routine) without abnormal findings: Secondary | ICD-10-CM

## 2018-07-29 NOTE — Progress Notes (Signed)
Jodi Rodriguez 1961/01/29 [id number]    History:    Presents for annual exam.  Postmenopausal on no HRT with no bleeding.  Normal Pap and mammogram history overdue for mammogram.  2018- colonoscopy.  Primary care manages labs and meds for hypertension.  Past medical history, past surgical history, family history and social history were all reviewed and documented in the EPIC chart.  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  Parents hypertension, mother Alzheimer's, father diabetes.  Daughter in graduate school in [place], son lives in [place] both doing well.  ROS:  A ROS was performed and pertinent positives and negatives are included.  Exam:  Vitals:   07/29/18 1011  BP: 128/80  Weight: 159 lb 12.8 oz (72.5 kg)  Height: 5\' 1"  (1.549 m)   Body mass index is 30.19 kg/m.   General appearance:  Normal Thyroid:  Symmetrical, normal in size, without palpable masses or nodularity. Respiratory  Auscultation:  Clear without wheezing or rhonchi Cardiovascular  Auscultation:  Regular rate, without rubs, murmurs or gallops  Edema/varicosities:  Not grossly evident Abdominal  Soft,nontender, without masses, guarding or rebound.  Liver/spleen:  No organomegaly noted  Hernia:  None appreciated  Skin  Inspection:  Grossly normal   Breasts: Examined lying and sitting.     Right: Without masses, retractions, discharge or axillary adenopathy.     Left: Without masses, retractions, discharge or axillary adenopathy. Gentitourinary   Inguinal/mons:  Normal without inguinal adenopathy  External genitalia:  Normal  BUS/Urethra/Skene's glands:  Normal  Vagina:  Normal  Cervix:  Normal  Uterus:  normal in size, shape and contour.  Midline and mobile  Adnexa/parametria:     Rt: Without masses or tenderness.   Lt: Without masses or tenderness.  Anus and perineum: Normal  Digital rectal exam: Normal sphincter tone without palpated masses or tenderness  Assessment/Plan:  57 y.o.  MBF G2 P2 for annual exam with no complaints.  Postmenopausal/no HRT/no bleeding Hypertension-primary care manages labs and meds  Plan: Bone density, instructed to schedule, reviewed importance of exercise, weightbearing and balance type.  SBE's, continue annual screening mammogram, calcium rich foods, vitamin D 2000 daily encouraged.  Pap normal 2017, new screening guidelines reviewed.  Overdue for mammogram instructed to schedule.   [place], 1:30 PM 07/29/2018

## 2018-07-29 NOTE — Patient Instructions (Signed)
[facility] [phone] Health Maintenance for Postmenopausal Women Menopause is a normal process in which your reproductive ability comes to an end. This process happens gradually over a span of months to years, usually between the ages of 48 and 55. Menopause is complete when you have missed 12 consecutive menstrual periods. It is important to talk with your health care provider about some of the most common conditions that affect postmenopausal women, such as heart disease, cancer, and bone loss (osteoporosis). Adopting a healthy lifestyle and getting preventive care can help to promote your health and wellness. Those actions can also lower your chances of developing some of these common conditions. What should I know about menopause? During menopause, you may experience a number of symptoms, such as:  Moderate-to-severe hot flashes.  Night sweats.  Decrease in sex drive.  Mood swings.  Headaches.  Tiredness.  Irritability.  Memory problems.  Insomnia.  Choosing to treat or not to treat menopausal changes is an individual decision that you make with your health care provider. What should I know about hormone replacement therapy and supplements? Hormone therapy products are effective for treating symptoms that are associated with menopause, such as hot flashes and night sweats. Hormone replacement carries certain risks, especially as you become older. If you are thinking about using estrogen or estrogen with progestin treatments, discuss the benefits and risks with your health care provider. What should I know about heart disease and stroke? Heart disease, heart attack, and stroke become more likely as you age. This may be due, in part, to the hormonal changes that your body experiences during menopause. These can affect how your body processes dietary fats, triglycerides, and cholesterol. Heart attack and stroke are both medical emergencies. There are many things that you can do to  help prevent heart disease and stroke:  Have your blood pressure checked at least every 1-2 years. High blood pressure causes heart disease and increases the risk of stroke.  If you are 55-79 years old, ask your health care provider if you should take aspirin to prevent a heart attack or a stroke.  Do not use any tobacco products, including cigarettes, chewing tobacco, or electronic cigarettes. If you need help quitting, ask your health care provider.  It is important to eat a healthy diet and maintain a healthy weight. ? Be sure to include plenty of vegetables, fruits, low-fat dairy products, and lean protein. ? Avoid eating foods that are high in solid fats, added sugars, or salt (sodium).  Get regular exercise. This is one of the most important things that you can do for your health. ? Try to exercise for at least 150 minutes each week. The type of exercise that you do should increase your heart rate and make you sweat. This is known as moderate-intensity exercise. ? Try to do strengthening exercises at least twice each week. Do these in addition to the moderate-intensity exercise.  Know your numbers.Ask your health care provider to check your cholesterol and your blood glucose. Continue to have your blood tested as directed by your health care provider.  What should I know about cancer screening? There are several types of cancer. Take the following steps to reduce your risk and to catch any cancer development as early as possible. Breast Cancer  Practice breast self-awareness. ? This means understanding how your breasts normally appear and feel. ? It also means doing regular breast self-exams. Let your health care provider know about any changes, no matter how small.  If   If you are 40 or older, have a clinician do a breast exam (clinical breast exam or CBE) every year. Depending on your age, family history, and medical history, it may be recommended that you also have a yearly breast  X-ray (mammogram).  If you have a family history of breast cancer, talk with your health care provider about genetic screening.  If you are at high risk for breast cancer, talk with your health care provider about having an MRI and a mammogram every year.  Breast cancer (BRCA) gene test is recommended for women who have family members with BRCA-related cancers. Results of the assessment will determine the need for genetic counseling and BRCA1 and for BRCA2 testing. BRCA-related cancers include these types: ? Breast. This occurs in males or females. ? Ovarian. ? Tubal. This may also be called fallopian tube cancer. ? Cancer of the abdominal or pelvic lining (peritoneal cancer). ? Prostate. ? Pancreatic.  Cervical, Uterine, and Ovarian Cancer Your health care provider may recommend that you be screened regularly for cancer of the pelvic organs. These include your ovaries, uterus, and vagina. This screening involves a pelvic exam, which includes checking for microscopic changes to the surface of your cervix (Pap test).  For women ages 21-65, health care providers may recommend a pelvic exam and a Pap test every three years. For women ages 30-65, they may recommend the Pap test and pelvic exam, combined with testing for human papilloma virus (HPV), every five years. Some types of HPV increase your risk of cervical cancer. Testing for HPV may also be done on women of any age who have unclear Pap test results.  Other health care providers may not recommend any screening for nonpregnant women who are considered low risk for pelvic cancer and have no symptoms. Ask your health care provider if a screening pelvic exam is right for you.  If you have had past treatment for cervical cancer or a condition that could lead to cancer, you need Pap tests and screening for cancer for at least 20 years after your treatment. If Pap tests have been discontinued for you, your risk factors (such as having a new sexual  partner) need to be reassessed to determine if you should start having screenings again. Some women have medical problems that increase the chance of getting cervical cancer. In these cases, your health care provider may recommend that you have screening and Pap tests more often.  If you have a family history of uterine cancer or ovarian cancer, talk with your health care provider about genetic screening.  If you have vaginal bleeding after reaching menopause, tell your health care provider.  There are currently no reliable tests available to screen for ovarian cancer.  Lung Cancer Lung cancer screening is recommended for adults 55-80 years old who are at high risk for lung cancer because of a history of smoking. A yearly low-dose CT scan of the lungs is recommended if you:  Currently smoke.  Have a history of at least 30 pack-years of smoking and you currently smoke or have quit within the past 15 years. A pack-year is smoking an average of one pack of cigarettes per day for one year.  Yearly screening should:  Continue until it has been 15 years since you quit.  Stop if you develop a health problem that would prevent you from having lung cancer treatment.  Colorectal Cancer  This type of cancer can be detected and can often be prevented.  Routine colorectal cancer   screening usually begins at age 50 and continues through age 75.  If you have risk factors for colon cancer, your health care provider may recommend that you be screened at an earlier age.  If you have a family history of colorectal cancer, talk with your health care provider about genetic screening.  Your health care provider may also recommend using home test kits to check for hidden blood in your stool.  A small camera at the end of a tube can be used to examine your colon directly (sigmoidoscopy or colonoscopy). This is done to check for the earliest forms of colorectal cancer.  Direct examination of the colon  should be repeated every 5-10 years until age 75. However, if early forms of precancerous polyps or small growths are found or if you have a family history or genetic risk for colorectal cancer, you may need to be screened more often.  Skin Cancer  Check your skin from head to toe regularly.  Monitor any moles. Be sure to tell your health care provider: ? About any new moles or changes in moles, especially if there is a change in a mole's shape or color. ? If you have a mole that is larger than the size of a pencil eraser.  If any of your family members has a history of skin cancer, especially at a young age, talk with your health care provider about genetic screening.  Always use sunscreen. Apply sunscreen liberally and repeatedly throughout the day.  Whenever you are outside, protect yourself by wearing long sleeves, pants, a wide-brimmed hat, and sunglasses.  What should I know about osteoporosis? Osteoporosis is a condition in which bone destruction happens more quickly than new bone creation. After menopause, you may be at an increased risk for osteoporosis. To help prevent osteoporosis or the bone fractures that can happen because of osteoporosis, the following is recommended:  If you are 19-50 years old, get at least 1,000 mg of calcium and at least 600 mg of vitamin D per day.  If you are older than age 50 but younger than age 70, get at least 1,200 mg of calcium and at least 600 mg of vitamin D per day.  If you are older than age 70, get at least 1,200 mg of calcium and at least 800 mg of vitamin D per day.  Smoking and excessive alcohol intake increase the risk of osteoporosis. Eat foods that are rich in calcium and vitamin D, and do weight-bearing exercises several times each week as directed by your health care provider. What should I know about how menopause affects my mental health? Depression may occur at any age, but it is more common as you become older. Common symptoms of  depression include:  Low or sad mood.  Changes in sleep patterns.  Changes in appetite or eating patterns.  Feeling an overall lack of motivation or enjoyment of activities that you previously enjoyed.  Frequent crying spells.  Talk with your health care provider if you think that you are experiencing depression. What should I know about immunizations? It is important that you get and maintain your immunizations. These include:  Tetanus, diphtheria, and pertussis (Tdap) booster vaccine.  Influenza every year before the flu season begins.  Pneumonia vaccine.  Shingles vaccine.  Your health care provider may also recommend other immunizations. This information is not intended to replace advice given to you by your health care provider. Make sure you discuss any questions you have with your health care   provider. Document Released: 12/21/2005 Document Revised: 05/18/2016 Document Reviewed: 08/02/2015 Elsevier Interactive Patient Education  2018 [employer].

## 2018-12-07 ENCOUNTER — Other Ambulatory Visit: Payer: Self-pay | Admitting: Family Medicine

## 2018-12-07 DIAGNOSIS — I10 Essential (primary) hypertension: Secondary | ICD-10-CM

## 2019-05-30 ENCOUNTER — Other Ambulatory Visit: Payer: Self-pay | Admitting: Family Medicine

## 2019-05-30 DIAGNOSIS — I10 Essential (primary) hypertension: Secondary | ICD-10-CM

## 2019-06-08 ENCOUNTER — Encounter: Payer: Self-pay | Admitting: Family Medicine

## 2019-06-08 ENCOUNTER — Other Ambulatory Visit: Payer: Self-pay

## 2019-06-08 ENCOUNTER — Ambulatory Visit (INDEPENDENT_AMBULATORY_CARE_PROVIDER_SITE_OTHER): Payer: BC Managed Care – PPO | Admitting: Family Medicine

## 2019-06-08 VITALS — BP 156/79 | HR 68 | Ht 61.0 in | Wt 184.4 lb

## 2019-06-08 DIAGNOSIS — E785 Hyperlipidemia, unspecified: Secondary | ICD-10-CM

## 2019-06-08 DIAGNOSIS — Z0000 Encounter for general adult medical examination without abnormal findings: Secondary | ICD-10-CM

## 2019-06-08 DIAGNOSIS — I10 Essential (primary) hypertension: Secondary | ICD-10-CM | POA: Diagnosis not present

## 2019-06-08 LAB — CBC WITH DIFFERENTIAL/PLATELET
Basophils Absolute: 0 10*3/uL (ref 0.0–0.1)
Basophils Relative: 0.5 % (ref 0.0–3.0)
Eosinophils Absolute: 0.1 10*3/uL (ref 0.0–0.7)
Eosinophils Relative: 2.6 % (ref 0.0–5.0)
HCT: 37.9 % (ref 36.0–46.0)
Hemoglobin: 12.7 g/dL (ref 12.0–15.0)
Lymphocytes Relative: 51.4 % — ABNORMAL HIGH (ref 12.0–46.0)
Lymphs Abs: 2.5 10*3/uL (ref 0.7–4.0)
MCHC: 33.4 g/dL (ref 30.0–36.0)
MCV: 86 fl (ref 78.0–100.0)
Monocytes Absolute: 0.3 10*3/uL (ref 0.1–1.0)
Monocytes Relative: 5.5 % (ref 3.0–12.0)
Neutro Abs: 1.9 10*3/uL (ref 1.4–7.7)
Neutrophils Relative %: 40 % — ABNORMAL LOW (ref 43.0–77.0)
Platelets: 228 10*3/uL (ref 150.0–400.0)
RBC: 4.4 Mil/uL (ref 3.87–5.11)
RDW: 13.8 % (ref 11.5–15.5)
WBC: 4.8 10*3/uL (ref 4.0–10.5)

## 2019-06-08 LAB — COMPREHENSIVE METABOLIC PANEL
ALT: 17 U/L (ref 0–35)
AST: 18 U/L (ref 0–37)
Albumin: 4.4 g/dL (ref 3.5–5.2)
Alkaline Phosphatase: 61 U/L (ref 39–117)
BUN: 15 mg/dL (ref 6–23)
CO2: 27 mEq/L (ref 19–32)
Calcium: 9.6 mg/dL (ref 8.4–10.5)
Chloride: 106 mEq/L (ref 96–112)
Creatinine, Ser: 0.87 mg/dL (ref 0.40–1.20)
GFR: 81.55 mL/min (ref >60.00)
Glucose, Bld: 102 mg/dL — ABNORMAL HIGH (ref 70–99)
Potassium: 3.8 mEq/L (ref 3.5–5.1)
Sodium: 142 mEq/L (ref 135–145)
Total Bilirubin: 0.5 mg/dL (ref 0.2–1.2)
Total Protein: 7.2 g/dL (ref 6.0–8.3)

## 2019-06-08 LAB — LIPID PANEL
Cholesterol: 231 mg/dL — ABNORMAL HIGH (ref 0–200)
HDL: 72.5 mg/dL (ref >39.00)
LDL Cholesterol: 147 mg/dL — ABNORMAL HIGH (ref 0–99)
NonHDL: 158.38
Total CHOL/HDL Ratio: 3
Triglycerides: 59 mg/dL (ref 0.0–149.0)
VLDL: 11.8 mg/dL (ref 0.0–40.0)

## 2019-06-08 MED ORDER — AMLODIPINE BESYLATE 10 MG PO TABS: 10.0000 mg | ORAL_TABLET | Freq: Every day | ORAL | 3 refills | Status: DC

## 2019-06-08 MED ORDER — AMLODIPINE BESYLATE 5 MG PO TABS: 5.0000 mg | ORAL_TABLET | Freq: Every day | ORAL | 1 refills | Status: DC

## 2019-06-08 MED ORDER — METOPROLOL SUCCINATE ER 200 MG PO TB24: 200.0000 mg | ORAL_TABLET | Freq: Every day | ORAL | 3 refills | Status: DC

## 2019-06-08 NOTE — Assessment & Plan Note (Signed)
Pt did not want any vaccines today Check labs

## 2019-06-08 NOTE — Patient Instructions (Signed)

## 2019-06-08 NOTE — Progress Notes (Signed)
Subjective:     Jodi Rodriguez is a 58 y.o. female and is here for a comprehensive physical exam. The patient reports no problems.  Social History   Socioeconomic History  . Marital status: Married    Spouse name: Not on file  . Number of children: Not on file  . Years of education: Not on file  . Highest education level: Not on file  Occupational History  . Occupation: Science writer  . Financial resource strain: Not on file  . Food insecurity    Worry: Not on file    Inability: Not on file  . Transportation needs    Medical: Not on file    Non-medical: Not on file  Tobacco Use  . Smoking status: Never Smoker  . Smokeless tobacco: Never Used  Substance and Sexual Activity  . Alcohol use: Yes    Alcohol/week: 0.0 standard drinks    Comment: occass one weekly  . Drug use: No  . Sexual activity: Yes    Partners: Male    Birth control/protection: Other-see comments    Comment: vasectomy- fewer than 5 partners first sexual  active at age 6  Lifestyle  . Physical activity    Days per week: Not on file    Minutes per session: Not on file  . Stress: Not on file  Relationships  . Social Herbalist on phone: Not on file    Gets together: Not on file    Attends religious service: Not on file    Active member of club or organization: Not on file    Attends meetings of clubs or organizations: Not on file    Relationship status: Not on file  . Intimate partner violence    Fear of current or ex partner: Not on file    Emotionally abused: Not on file    Physically abused: Not on file    Forced sexual activity: Not on file  Other Topics Concern  . Not on file  Social History Narrative   Exercise-- 3x a week -- walk 1 mile on treadmill or stationary bike   Health Maintenance  Topic Date Due  . URINE MICROALBUMIN  07/28/2014  . PAP SMEAR-Modifier  07/20/2019  . MAMMOGRAM  06/07/2020 (Originally 01/25/2017)  . TETANUS/TDAP  06/07/2020 (Originally  05/24/2018)  . INFLUENZA VACCINE  06/13/2019  . COLONOSCOPY  05/09/2027  . Hepatitis C Screening  Completed  . HIV Screening  Completed    The following portions of the patient's history were reviewed and updated as appropriate:  She  has a past medical history of Anxiety, Fibroid, Hyperlipidemia, and Hypertension. She does not have any pertinent problems on file. She  has a past surgical history that includes Breast biopsy (1981). Her family history includes Alzheimer's disease in her mother and unknown relative; Diabetes in her father; Hypertension in her father, mother, and sister; Thyroid disease in her sister. She  reports that she has never smoked. She has never used smokeless tobacco. She reports current alcohol use. She reports that she does not use drugs. She has a current medication list which includes the following prescription(s): ascorbic acid, aspirin, metoprolol, multivitamin, fish oil, and amlodipine. Current Outpatient Medications on File Prior to Visit  Medication Sig Dispense Refill  . Ascorbic Acid (VITAMIN C PO) Take by mouth daily.      Marland Kitchen aspirin 81 MG chewable tablet Chew 81 mg by mouth daily.      . Multiple Vitamin (MULTIVITAMIN)  tablet Take 1 tablet by mouth daily.      . Omega-3 Fatty Acids (FISH OIL) 1000 MG CAPS Take 1 capsule by mouth daily.     No current facility-administered medications on file prior to visit.    She has No Known Allergies..  Review of Systems Review of Systems  Constitutional: Negative for activity change, appetite change and fatigue.  HENT: Negative for hearing loss, congestion, tinnitus and ear discharge.  dentist q2m Eyes: Negative for visual disturbance (see optho q1y -- vision corrected to 20/20 with glasses).  Respiratory: Negative for cough, chest tightness and shortness of breath.   Cardiovascular: Negative for chest pain, palpitations and leg swelling.  Gastrointestinal: Negative for abdominal pain, diarrhea, constipation and  abdominal distention.  Genitourinary: Negative for urgency, frequency, decreased urine volume and difficulty urinating.  Musculoskeletal: Negative for back pain, arthralgias and gait problem.  Skin: Negative for color change, pallor and rash.  Neurological: Negative for dizziness, light-headedness, numbness and headaches.  Hematological: Negative for adenopathy. Does not bruise/bleed easily.  Psychiatric/Behavioral: Negative for suicidal ideas, confusion, sleep disturbance, self-injury, dysphoric mood, decreased concentration and agitation.       Objective:    BP (!) 156/79 (BP Location: Left Arm, Patient Position: Sitting, Cuff Size: Normal)   Pulse 68   Ht 5\' 1"  (1.549 m)   Wt 184 lb 6.1 oz (83.6 kg)   LMP 02/26/2014   SpO2 98%   BMI 34.84 kg/m  General appearance: alert, cooperative, appears stated age and no distress Head: Normocephalic, without obvious abnormality, atraumatic Eyes: conjunctivae/corneas clear. PERRL, EOM's intact. Fundi benign. Ears: normal TM's and external ear canals both ears Nose: Nares normal. Septum midline. Mucosa normal. No drainage or sinus tenderness. Throat: lips, mucosa, and tongue normal; teeth and gums normal Neck: no adenopathy, no carotid bruit, no JVD, supple, symmetrical, trachea midline and thyroid not enlarged, symmetric, no tenderness/mass/nodules Back: symmetric, no curvature. ROM normal. No CVA tenderness. Lungs: clear to auscultation bilaterally Breasts: gyn Heart: regular rate and rhythm, S1, S2 normal, no murmur, click, rub or gallop Abdomen: soft, non-tender; bowel sounds normal; no masses,  no organomegaly Pelvic: deferred Extremities: extremities normal, atraumatic, no cyanosis or edema Pulses: 2+ and symmetric Skin: Skin color, texture, turgor normal. No rashes or lesions Lymph nodes: Cervical, supraclavicular, and axillary nodes normal. Neurologic: Alert and oriented X 3, normal strength and tone. Normal symmetric reflexes.  Normal coordination and gait    Assessment:    Healthy female exam.     Plan:    ghm utd Pt did not want tetanus or shingrix today See After Visit Summary for Counseling Recommendations   1. Essential hypertension Patient ID: JUANETTE URIZAR, female    DOB: 02-Jun-1961  Age: 58 y.o. MRN: [id number]    Subjective:  Subjective  HPI JOSSLYN CIOLEK presents for cpe and f/u bp and cholesterol     No complaints but she does not want to get undressed today  Review of Systems  Constitutional: Negative for activity change, appetite change, diaphoresis, fatigue and unexpected weight change.  Eyes: Negative for pain, redness and visual disturbance.  Respiratory: Negative for cough, chest tightness, shortness of breath and wheezing.   Cardiovascular: Negative for chest pain, palpitations and leg swelling.  Endocrine: Negative for cold intolerance, heat intolerance, polydipsia, polyphagia and polyuria.  Genitourinary: Negative for difficulty urinating, dysuria and frequency.  Neurological: Negative for dizziness, light-headedness, numbness and headaches.  Psychiatric/Behavioral: Negative for behavioral problems and dysphoric mood. The patient is not nervous/anxious.  History Past Medical History:  Diagnosis Date  . Anxiety   . Fibroid    16 WK SIZE UT  . Hyperlipidemia   . Hypertension     She has a past surgical history that includes Breast biopsy (1981).   Her family history includes Alzheimer's disease in her mother and unknown relative; Diabetes in her father; Hypertension in her father, mother, and sister; Thyroid disease in her sister.She reports that she has never smoked. She has never used smokeless tobacco. She reports current alcohol use. She reports that she does not use drugs.  Current Outpatient Medications on File Prior to Visit  Medication Sig Dispense Refill  . Ascorbic Acid (VITAMIN C PO) Take by mouth daily.      Marland Kitchen aspirin 81 MG chewable tablet Chew 81 mg by  mouth daily.      . Multiple Vitamin (MULTIVITAMIN) tablet Take 1 tablet by mouth daily.      . Omega-3 Fatty Acids (FISH OIL) 1000 MG CAPS Take 1 capsule by mouth daily.     No current facility-administered medications on file prior to visit.      Objective:  Objective  Physical Exam Vitals signs and nursing note reviewed.  Constitutional:      General: She is not in acute distress.    Appearance: She is well-developed.  HENT:     Head: Normocephalic and atraumatic.     Right Ear: External ear normal.     Left Ear: External ear normal.     Nose: Nose normal.  Eyes:     Conjunctiva/sclera: Conjunctivae normal.     Pupils: Pupils are equal, round, and reactive to light.  Neck:     Musculoskeletal: Normal range of motion and neck supple.     Thyroid: No thyromegaly.     Vascular: No carotid bruit or JVD.  Cardiovascular:     Rate and Rhythm: Normal rate and regular rhythm.     Heart sounds: Normal heart sounds. No murmur.  Pulmonary:     Effort: Pulmonary effort is normal. No respiratory distress.     Breath sounds: Normal breath sounds. No wheezing or rales.  Chest:     Chest wall: No tenderness.  Neurological:     Mental Status: She is alert and oriented to person, place, and time.  Psychiatric:        Behavior: Behavior normal.        Thought Content: Thought content normal.        Judgment: Judgment normal.    BP (!) 156/79 (BP Location: Left Arm, Patient Position: Sitting, Cuff Size: Normal)   Pulse 68   Ht 5\' 1"  (1.549 m)   Wt 184 lb 6.1 oz (83.6 kg)   LMP 02/26/2014   SpO2 98%   BMI 34.84 kg/m  Wt Readings from Last 3 Encounters:  06/08/19 184 lb 6.1 oz (83.6 kg)  07/29/18 159 lb 12.8 oz (72.5 kg)  06/05/18 155 lb 9.6 oz (70.6 kg)     Lab Results  Component Value Date   WBC 4.7 06/05/2018   HGB 13.1 06/05/2018   HCT 39.7 06/05/2018   PLT 212.0 06/05/2018   GLUCOSE 95 06/05/2018   CHOL 232 (H) 06/05/2018   TRIG 35.0 06/05/2018   HDL 78.70  06/05/2018   LDLDIRECT 151.1 07/28/2013   LDLCALC 147 (H) 06/05/2018   ALT 12 06/05/2018   AST 17 06/05/2018   NA 140 06/05/2018   K 4.0 06/05/2018   CL 105 06/05/2018  CREATININE 0.82 06/05/2018   BUN 19 06/05/2018   CO2 28 06/05/2018   TSH 1.73 06/05/2018   MICROALBUR 0.3 07/28/2013    Mm Screening Breast Tomo Bilateral  Result Date: 01/26/2016 CLINICAL DATA:  Screening. EXAM: DIGITAL SCREENING BILATERAL MAMMOGRAM WITH 3D TOMO WITH CAD COMPARISON:  Previous exam(s). ACR Breast Density Category c: The breast tissue is heterogeneously dense, which may obscure small masses. FINDINGS: There are no findings suspicious for malignancy. Images were processed with CAD. IMPRESSION: No mammographic evidence of malignancy. A result letter of this screening mammogram will be mailed directly to the patient. RECOMMENDATION: Screening mammogram in one year. (Code:SM-B-01Y) BI-RADS CATEGORY  1: Negative. Electronically Signed   By: Abelardo Diesel M.D.   On: 01/27/2016 12:22     Assessment & Plan:  Plan  I have discontinued Calley J. Conyer's amLODipine and amLODipine. I have also changed her metoprolol. Additionally, I am having her start on amLODipine. Lastly, I am having her maintain her aspirin, multivitamin, Ascorbic Acid (VITAMIN C PO), and Fish Oil. 1. Essential hypertension Poorly controlled will alter medications, encouraged DASH diet, minimize caffeine and obtain adequate sleep. Report concerning symptoms and follow up as directed and as needed - CBC with Differential/Platelet - Lipid panel - Comprehensive metabolic panel - metoprolol (TOPROL-XL) 200 MG 24 hr tablet; Take 1 tablet (200 mg total) by mouth daily.  Dispense: 90 tablet; Refill: 3 - amLODipine (NORVASC) 10 MG tablet; Take 1 tablet (10 mg total) by mouth daily.  Dispense: 90 tablet; Refill: 3  2. Hyperlipidemia, unspecified hyperlipidemia type Encouraged heart healthy diet, increase exercise, avoid trans fats, consider a krill  oil cap daily - Lipid panel - Comprehensive metabolic panel  3. Preventative health care See above   Meds ordered this encounter  Medications  . DISCONTD: amLODipine (NORVASC) 5 MG tablet    Sig: Take 1 tablet (5 mg total) by mouth daily.    Dispense:  90 tablet    Refill:  1    Needs office or virtual visit for further refills  . metoprolol (TOPROL-XL) 200 MG 24 hr tablet    Sig: Take 1 tablet (200 mg total) by mouth daily.    Dispense:  90 tablet    Refill:  3  . amLODipine (NORVASC) 10 MG tablet    Sig: Take 1 tablet (10 mg total) by mouth daily.    Dispense:  90 tablet    Refill:  3    Problem List Items Addressed This Visit      Unprioritized   Hyperlipidemia   Relevant Medications   metoprolol (TOPROL-XL) 200 MG 24 hr tablet   amLODipine (NORVASC) 10 MG tablet   Other Relevant Orders   Lipid panel   Comprehensive metabolic panel   Hypertension - Primary   Relevant Medications   metoprolol (TOPROL-XL) 200 MG 24 hr tablet   amLODipine (NORVASC) 10 MG tablet   Other Relevant Orders   CBC with Differential/Platelet   Lipid panel   Comprehensive metabolic panel   Preventative health care    Pt did not want any vaccines today Check labs          Follow-up: Return in about 3 weeks (around 06/29/2019), or if symptoms worsen or fail to improve, for hypertension.  Rosalita Chessman Chase, DO     - CBC with Differential/Platelet - Lipid panel - Comprehensive metabolic panel - metoprolol (TOPROL-XL) 200 MG 24 hr tablet; Take 1 tablet (200 mg total) by mouth daily.  Dispense: 90 tablet;  Refill: 3 - amLODipine (NORVASC) 10 MG tablet; Take 1 tablet (10 mg total) by mouth daily.  Dispense: 90 tablet; Refill: 3  2

## 2019-06-10 ENCOUNTER — Other Ambulatory Visit: Payer: Self-pay | Admitting: Family Medicine

## 2019-06-10 ENCOUNTER — Encounter: Payer: Self-pay | Admitting: Family Medicine

## 2019-06-10 DIAGNOSIS — R739 Hyperglycemia, unspecified: Secondary | ICD-10-CM

## 2019-06-10 DIAGNOSIS — E785 Hyperlipidemia, unspecified: Secondary | ICD-10-CM

## 2019-06-30 ENCOUNTER — Ambulatory Visit: Payer: BC Managed Care – PPO

## 2019-08-03 ENCOUNTER — Other Ambulatory Visit: Payer: Self-pay

## 2019-08-04 ENCOUNTER — Encounter: Payer: Self-pay | Admitting: Women's Health

## 2019-08-04 ENCOUNTER — Ambulatory Visit: Payer: BC Managed Care – PPO | Admitting: Women's Health

## 2019-08-04 VITALS — BP 124/80 | Ht 62.0 in | Wt 188.0 lb

## 2019-08-04 DIAGNOSIS — Z124 Encounter for screening for malignant neoplasm of cervix: Secondary | ICD-10-CM

## 2019-08-04 DIAGNOSIS — Z1151 Encounter for screening for human papillomavirus (HPV): Secondary | ICD-10-CM

## 2019-08-04 DIAGNOSIS — Z01419 Encounter for gynecological examination (general) (routine) without abnormal findings: Secondary | ICD-10-CM

## 2019-08-04 NOTE — Progress Notes (Signed)
Jodi Rodriguez [birth year] [id number]    History:    Presents for annual exam.  Postmenopausal on no HRT with no bleeding.  Normal Pap history overdue for mammogram.  Primary care manages hypertension.  2018- colonoscopy.  Has not had a screening DEXA.  Past medical history, past surgical history, family history and social history were all reviewed and documented in the EPIC chart.  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  Daughter is finished graduate school working, son is in [place].  Parents hypertension, father diabetes, mother Alzheimer's.  ROS:  A ROS was performed and pertinent positives and negatives are included.  Exam:  Vitals:   08/04/19 1005  BP: 124/80  Weight: 188 lb (85.3 kg)  Height: 5\' 2"  (1.575 m)   Body mass index is 34.39 kg/m.   General appearance:  Normal Thyroid:  Symmetrical, normal in size, without palpable masses or nodularity. Respiratory  Auscultation:  Clear without wheezing or rhonchi Cardiovascular  Auscultation:  Regular rate, without rubs, murmurs or gallops  Edema/varicosities:  Not grossly evident Abdominal  Soft,nontender, without masses, guarding or rebound.  Liver/spleen:  No organomegaly noted  Hernia:  None appreciated  Skin  Inspection:  Grossly normal   Breasts: Examined lying and sitting.     Right: Without masses, retractions, discharge or axillary adenopathy.  Extra breast tissue and axilla area no change     Left: Without masses, retractions, discharge or axillary adenopathy. Gentitourinary   Inguinal/mons:  Normal without inguinal adenopathy  External genitalia:  Normal  BUS/Urethra/Skene's glands:  Normal  Vagina:  Normal  Cervix:  Normal  Uterus:  normal in size, shape and contour.  Midline and mobile  Adnexa/parametria:     Rt: Without masses or tenderness.   Lt: Without masses or tenderness.  Anus and perineum: Normal  Digital rectal exam: Normal sphincter  tone without palpated masses or tenderness  Assessment/Plan:  58 y.o. MBF G2, P2 for annual exam with no complaints.  Postmenopausal/no HRT/no bleeding Hypertension primary care manages labs and meds Obesity  Plan: SBEs, reviewed importance of annual screening mammogram breast tissue dense, last mammogram 2017 phone number given instructed to schedule.  Encouraged to decrease calorie/carbs, increase regular cardio type and balance type exercise, home safety, fall prevention discussed.  DEXA instructed to schedule.  Flu vaccine reviewed, declined.  Pap normal 2017, Pap with HR HPV typing, new screening guidelines reviewed.    Huel Cote [facility], 10:19 AM 08/04/2019

## 2019-08-04 NOTE — Patient Instructions (Signed)
Breast center [phone] for mammogram  Health Maintenance for Postmenopausal Women Menopause is a normal process in which your ability to get pregnant comes to an end. This process happens slowly over many months or years, usually between the ages of 58 and 26. Menopause is complete when you have missed your menstrual periods for 12 months. It is important to talk with your health care provider about some of the most common conditions that affect women after menopause (postmenopausal women). These include heart disease, cancer, and bone loss (osteoporosis). Adopting a healthy lifestyle and getting preventive care can help to promote your health and wellness. The actions you take can also lower your chances of developing some of these common conditions. What should I know about menopause? During menopause, you may get a number of symptoms, such as:  Hot flashes. These can be moderate or severe.  Night sweats.  Decrease in sex drive.  Mood swings.  Headaches.  Tiredness.  Irritability.  Memory problems.  Insomnia. Choosing to treat or not to treat these symptoms is a decision that you make with your health care provider. Do I need hormone replacement therapy?  Hormone replacement therapy is effective in treating symptoms that are caused by menopause, such as hot flashes and night sweats.  Hormone replacement carries certain risks, especially as you become older. If you are thinking about using estrogen or estrogen with progestin, discuss the benefits and risks with your health care provider. What is my risk for heart disease and stroke? The risk of heart disease, heart attack, and stroke increases as you age. One of the causes may be a change in the body's hormones during menopause. This can affect how your body uses dietary fats, triglycerides, and cholesterol. Heart attack and stroke are medical emergencies. There are many things that you can do to help prevent heart disease and  stroke. Watch your blood pressure  High blood pressure causes heart disease and increases the risk of stroke. This is more likely to develop in people who have high blood pressure readings, are of African descent, or are overweight.  Have your blood pressure checked: ? Every 3-5 years if you are 15-51 years of age. ? Every year if you are 28 years old or older. Eat a healthy diet   Eat a diet that includes plenty of vegetables, fruits, low-fat dairy products, and lean protein.  Do not eat a lot of foods that are high in solid fats, added sugars, or sodium. Get regular exercise Get regular exercise. This is one of the most important things you can do for your health. Most adults should:  Try to exercise for at least 150 minutes each week. The exercise should increase your heart rate and make you sweat (moderate-intensity exercise).  Try to do strengthening exercises at least twice each week. Do these in addition to the moderate-intensity exercise.  Spend less time sitting. Even light physical activity can be beneficial. Other tips  Work with your health care provider to achieve or maintain a healthy weight.  Do not use any products that contain nicotine or tobacco, such as cigarettes, e-cigarettes, and chewing tobacco. If you need help quitting, ask your health care provider.  Know your numbers. Ask your health care provider to check your cholesterol and your blood sugar (glucose). Continue to have your blood tested as directed by your health care provider. Do I need screening for cancer? Depending on your health history and family history, you may need to have cancer screening at  different stages of your life. This may include screening for:  Breast cancer.  Cervical cancer.  Lung cancer.  Colorectal cancer. What is my risk for osteoporosis? After menopause, you may be at increased risk for osteoporosis. Osteoporosis is a condition in which bone destruction happens more  quickly than new bone creation. To help prevent osteoporosis or the bone fractures that can happen because of osteoporosis, you may take the following actions:  If you are 51-75 years old, get at least 1,000 mg of calcium and at least 600 mg of vitamin D per day.  If you are older than age 61 but younger than age 27, get at least 1,200 mg of calcium and at least 600 mg of vitamin D per day.  If you are older than age 68, get at least 1,200 mg of calcium and at least 800 mg of vitamin D per day. Smoking and drinking excessive alcohol increase the risk of osteoporosis. Eat foods that are rich in calcium and vitamin D, and do weight-bearing exercises several times each week as directed by your health care provider. How does menopause affect my mental health? Depression may occur at any age, but it is more common as you become older. Common symptoms of depression include:  Low or sad mood.  Changes in sleep patterns.  Changes in appetite or eating patterns.  Feeling an overall lack of motivation or enjoyment of activities that you previously enjoyed.  Frequent crying spells. Talk with your health care provider if you think that you are experiencing depression. General instructions See your health care provider for regular wellness exams and vaccines. This may include:  Scheduling regular health, dental, and eye exams.  Getting and maintaining your vaccines. These include: ? Influenza vaccine. Get this vaccine each year before the flu season begins. ? Pneumonia vaccine. ? Shingles vaccine. ? Tetanus, diphtheria, and pertussis (Tdap) booster vaccine. Your health care provider may also recommend other immunizations. Tell your health care provider if you have ever been abused or do not feel safe at home. Summary  Menopause is a normal process in which your ability to get pregnant comes to an end.  This condition causes hot flashes, night sweats, decreased interest in sex, mood swings,  headaches, or lack of sleep.  Treatment for this condition may include hormone replacement therapy.  Take actions to keep yourself healthy, including exercising regularly, eating a healthy diet, watching your weight, and checking your blood pressure and blood sugar levels.  Get screened for cancer and depression. Make sure that you are up to date with all your vaccines. This information is not intended to replace advice given to you by your health care provider. Make sure you discuss any questions you have with your health care provider. Document Released: 12/21/2005 Document Revised: 10/22/2018 Document Reviewed: 10/22/2018 Elsevier Patient Education  2020 [employer].

## 2019-08-06 LAB — PAP, TP IMAGING W/ HPV RNA, RFLX HPV TYPE 16,18/45: HPV DNA High Risk: NOT DETECTED

## 2019-10-27 ENCOUNTER — Other Ambulatory Visit: Payer: Self-pay | Admitting: Family Medicine

## 2019-10-27 DIAGNOSIS — I10 Essential (primary) hypertension: Secondary | ICD-10-CM

## 2019-10-27 MED ORDER — METOPROLOL SUCCINATE ER 200 MG PO TB24: 200.0000 mg | ORAL_TABLET | Freq: Every day | ORAL | 0 refills | Status: DC

## 2019-10-27 NOTE — Telephone Encounter (Signed)
Copied from [place] [id number]. Topic: Quick Communication - Rx Refill/Question >> Oct 27, 2019 11:03 AM Rainey Pines A wrote: Medication: metoprolol (TOPROL-XL) 200 MG 24 hr tablet (Patient requesting a small amount of this medication [facility] [place] until her mail order comes in.)  amLODipine (NORVASC) 10 MG tablet (Patient would like amlodipine sent to [place], [place]  Phone:  [phone] Fax:  [phone]     Has the patient contacted their pharmacy? Yes (Agent: If no, request that the patient contact the pharmacy for the refill.) (Agent: If yes, when and what did the pharmacy advise?)Contact PCP  Preferred Pharmacy (with phone number or street name): [facility] [place], [place] [facility]  Phone:  [phone] Fax:  [phone]     Agent: Please be advised that RX refills may take up to 3 business days. We ask that you follow-up with your pharmacy.

## 2019-11-19 ENCOUNTER — Other Ambulatory Visit: Payer: Self-pay | Admitting: Family Medicine

## 2019-11-19 DIAGNOSIS — I10 Essential (primary) hypertension: Secondary | ICD-10-CM

## 2019-11-19 NOTE — Telephone Encounter (Signed)
Last OV 06/08/19 Last refill Metoprolol 10/27/19 #30/0 Amlodipine changed to 10 mg 06/08/19 #90/3 Next OV 12/10/19

## 2019-11-30 ENCOUNTER — Other Ambulatory Visit: Payer: Self-pay | Admitting: Family Medicine

## 2019-11-30 DIAGNOSIS — I10 Essential (primary) hypertension: Secondary | ICD-10-CM

## 2019-12-02 ENCOUNTER — Encounter: Payer: Self-pay | Admitting: Family Medicine

## 2019-12-02 ENCOUNTER — Ambulatory Visit (INDEPENDENT_AMBULATORY_CARE_PROVIDER_SITE_OTHER): Payer: 59 | Admitting: Family Medicine

## 2019-12-02 ENCOUNTER — Other Ambulatory Visit: Payer: Self-pay

## 2019-12-02 DIAGNOSIS — I10 Essential (primary) hypertension: Secondary | ICD-10-CM

## 2019-12-02 MED ORDER — METOPROLOL SUCCINATE ER 200 MG PO TB24: 200.0000 mg | ORAL_TABLET | Freq: Every day | ORAL | 0 refills | Status: DC

## 2019-12-02 MED ORDER — AMLODIPINE BESYLATE 10 MG PO TABS: 10.0000 mg | ORAL_TABLET | Freq: Every day | ORAL | 3 refills | Status: DC

## 2019-12-02 NOTE — Progress Notes (Addendum)
Virtual Visit via Telephone Note  I connected with Jodi Rodriguez on 12/02/19 at  9:20 AM EST by telephone and verified that I am speaking with the correct person using two identifiers.  Location: Patient: home alone Provider: home    I discussed the limitations, risks, security and privacy concerns of performing an evaluation and management service by telephone and the availability of in person appointments. I also discussed with the patient that there may be a patient responsible charge related to this service. The patient expressed understanding and agreed to proceed.   History of Present Illness: Pt is home alone-- she needs refills on he bp meds/  She never took the norvasc 10 mg -- she is still taking 5 mg Pt with no complaints    Past Medical History:  Diagnosis Date  . Anxiety   . Fibroid    16 WK SIZE UT  . Hyperlipidemia   . Hypertension    Current Outpatient Medications on File Prior to Visit  Medication Sig Dispense Refill  . Ascorbic Acid (VITAMIN C PO) Take by mouth daily.      . Multiple Vitamin (MULTIVITAMIN) tablet Take 1 tablet by mouth daily.       No current facility-administered medications on file prior to visit.   No Known Allergies  Observations/Objective: 153/83   Repeat  182/82   70   Pt is in NAD  Assessment and Plan: 1. Essential hypertension Poorly controlled will alter medications, encouraged DASH diet, minimize caffeine and obtain adequate sleep. Report concerning symptoms and follow up as directed and as needed - metoprolol (TOPROL-XL) 200 MG 24 hr tablet; Take 1 tablet (200 mg total) by mouth daily.  Dispense: 30 tablet; Refill: 0 - amLODipine (NORVASC) 10 MG tablet; Take 1 tablet (10 mg total) by mouth daily.  Dispense: 90 tablet; Refill: 3 Pt will send my chart message with bp readings in 2 weeks  F/u 3 months  Follow Up Instructions:    I discussed the assessment and treatment plan with the patient. The patient was provided an  opportunity to ask questions and all were answered. The patient agreed with the plan and demonstrated an understanding of the instructions.   The patient was advised to call back or seek an in-person evaluation if the symptoms worsen or if the condition fails to improve as anticipated.  Pt virtual visit -- 20 min  Ann Held, DO

## 2019-12-03 ENCOUNTER — Telehealth: Payer: Self-pay | Admitting: Family Medicine

## 2019-12-03 DIAGNOSIS — I10 Essential (primary) hypertension: Secondary | ICD-10-CM

## 2019-12-03 MED ORDER — AMLODIPINE BESYLATE 10 MG PO TABS: 10.0000 mg | ORAL_TABLET | Freq: Every day | ORAL | 3 refills | Status: DC

## 2019-12-03 MED ORDER — METOPROLOL SUCCINATE ER 200 MG PO TB24: 200.0000 mg | ORAL_TABLET | Freq: Every day | ORAL | 1 refills | Status: DC

## 2019-12-03 NOTE — Telephone Encounter (Signed)
Refill sent.

## 2019-12-03 NOTE — Telephone Encounter (Signed)
Pt called because [facility] did not receive the A[facility]od[facility]ine [facility]. Pt would also like the metoprolol resent for 90 day supply with refills.   [facility]

## 2019-12-10 ENCOUNTER — Ambulatory Visit: Payer: BC Managed Care – PPO | Admitting: Family Medicine

## 2020-06-27 ENCOUNTER — Other Ambulatory Visit: Payer: Self-pay | Admitting: Family Medicine

## 2020-06-27 DIAGNOSIS — I10 Essential (primary) hypertension: Secondary | ICD-10-CM

## 2020-07-07 ENCOUNTER — Other Ambulatory Visit: Payer: Self-pay

## 2020-07-07 ENCOUNTER — Ambulatory Visit (HOSPITAL_BASED_OUTPATIENT_CLINIC_OR_DEPARTMENT_OTHER)
Admission: RE | Admit: 2020-07-07 | Discharge: 2020-07-07 | Disposition: A | Discharge status: Home / Self Care | Payer: 59 | Source: Ambulatory Visit | Attending: Family Medicine | Admitting: Family Medicine

## 2020-07-07 ENCOUNTER — Ambulatory Visit (INDEPENDENT_AMBULATORY_CARE_PROVIDER_SITE_OTHER): Payer: 59 | Admitting: Family Medicine

## 2020-07-07 ENCOUNTER — Encounter: Payer: Self-pay | Admitting: Family Medicine

## 2020-07-07 VITALS — BP 100/78 | HR 60 | Resp 18 | Ht 62.0 in | Wt 195.2 lb

## 2020-07-07 DIAGNOSIS — R0781 Pleurodynia: Secondary | ICD-10-CM | POA: Diagnosis not present

## 2020-07-07 DIAGNOSIS — I10 Essential (primary) hypertension: Secondary | ICD-10-CM

## 2020-07-07 DIAGNOSIS — R82998 Other abnormal findings in urine: Secondary | ICD-10-CM

## 2020-07-07 DIAGNOSIS — E785 Hyperlipidemia, unspecified: Secondary | ICD-10-CM

## 2020-07-07 DIAGNOSIS — Z0001 Encounter for general adult medical examination with abnormal findings: Secondary | ICD-10-CM

## 2020-07-07 DIAGNOSIS — Z0000 Encounter for general adult medical examination without abnormal findings: Secondary | ICD-10-CM

## 2020-07-07 LAB — CBC WITH DIFFERENTIAL/PLATELET
Absolute Monocytes: 291 cells/uL (ref 200–950)
Basophils Absolute: 39 cells/uL (ref 0–200)
Basophils Relative: 0.7 %
Eosinophils Absolute: 140 cells/uL (ref 15–500)
Eosinophils Relative: 2.5 %
HCT: 39.7 % (ref 35.0–45.0)
Hemoglobin: 13.2 g/dL (ref 11.7–15.5)
Lymphs Abs: 3119 cells/uL (ref 850–3900)
MCH: 27.7 pg (ref 27.0–33.0)
MCHC: 33.2 g/dL (ref 32.0–36.0)
MCV: 83.4 fL (ref 80.0–100.0)
MPV: 11.2 fL (ref 7.5–12.5)
Monocytes Relative: 5.2 %
Neutro Abs: 2010 cells/uL (ref 1500–7800)
Neutrophils Relative %: 35.9 %
Platelets: 277 10*3/uL (ref 140–400)
RBC: 4.76 10*6/uL (ref 3.80–5.10)
RDW: 14.4 % (ref 11.0–15.0)
Total Lymphocyte: 55.7 %
WBC: 5.6 10*3/uL (ref 3.8–10.8)

## 2020-07-07 LAB — TSH: TSH: 1.29 mIU/L (ref 0.40–4.50)

## 2020-07-07 LAB — COMPREHENSIVE METABOLIC PANEL
AG Ratio: 1.4 (calc) (ref 1.0–2.5)
ALT: 28 U/L (ref 6–29)
AST: 26 U/L (ref 10–35)
Albumin: 4.4 g/dL (ref 3.6–5.1)
Alkaline phosphatase (APISO): 59 U/L (ref 37–153)
BUN: 13 mg/dL (ref 7–25)
CO2: 26 mmol/L (ref 20–32)
Calcium: 10.5 mg/dL — ABNORMAL HIGH (ref 8.6–10.4)
Chloride: 103 mmol/L (ref 98–110)
Creat: 0.85 mg/dL (ref 0.50–1.05)
Globulin: 3.1 g/dL (calc) (ref 1.9–3.7)
Glucose, Bld: 102 mg/dL — ABNORMAL HIGH (ref 65–99)
Potassium: 4.1 mmol/L (ref 3.5–5.3)
Sodium: 139 mmol/L (ref 135–146)
Total Bilirubin: 0.6 mg/dL (ref 0.2–1.2)
Total Protein: 7.5 g/dL (ref 6.1–8.1)

## 2020-07-07 LAB — POC URINALSYSI DIPSTICK (AUTOMATED)
Bilirubin, UA: NEGATIVE
Blood, UA: NEGATIVE
Clarity, UA: NEGATIVE
Glucose, UA: NEGATIVE
Ketones, UA: NEGATIVE
Nitrite, UA: NEGATIVE
Protein, UA: NEGATIVE
Spec Grav, UA: 1.015 (ref 1.010–1.025)
Urobilinogen, UA: 0.2 E.U./dL
pH, UA: 6 (ref 5.0–8.0)

## 2020-07-07 LAB — LIPID PANEL
Cholesterol: 264 mg/dL — ABNORMAL HIGH (ref <200)
HDL: 63 mg/dL (ref >=50)
LDL Cholesterol (Calc): 184 mg/dL (calc) — ABNORMAL HIGH
Non-HDL Cholesterol (Calc): 201 mg/dL (calc) — ABNORMAL HIGH (ref <130)
Total CHOL/HDL Ratio: 4.2 (calc) (ref <5.0)
Triglycerides: 73 mg/dL (ref <150)

## 2020-07-07 MED ORDER — METOPROLOL SUCCINATE ER 200 MG PO TB24: 200.0000 mg | ORAL_TABLET | Freq: Every day | ORAL | 1 refills | Status: DC

## 2020-07-07 MED ORDER — AMLODIPINE BESYLATE 10 MG PO TABS: 10.0000 mg | ORAL_TABLET | Freq: Every day | ORAL | 3 refills | Status: DC

## 2020-07-07 NOTE — Patient Instructions (Signed)

## 2020-07-07 NOTE — Progress Notes (Signed)
Subjective:     Jodi Rodriguez is a 59 y.o. female and is here for a comprehensive physical exam. The patient reports problems - L upper rib pain --- usually when she is laying down and tries to roll over .  Social History   Socioeconomic History  . Marital status: Married    Spouse name: Not on file  . Number of children: Not on file  . Years of education: Not on file  . Highest education level: Not on file  Occupational History  . Occupation: Insurance underwriter  Tobacco Use  . Smoking status: Never Smoker  . Smokeless tobacco: Never Used  Vaping Use  . Vaping Use: Never used  Substance and Sexual Activity  . Alcohol use: Yes    Alcohol/week: 0.0 standard drinks    Comment: occass one weekly  . Drug use: No  . Sexual activity: Yes    Partners: Male    Birth control/protection: Other-see comments    Comment: vasectomy- fewer than 5 partners first sexual  active at age 74  Other Topics Concern  . Not on file  Social History Narrative   Exercise-- 3x a week -- walk 1 mile on treadmill or stationary bike   Social Determinants of Health   Financial Resource Strain:   . Difficulty of Paying Living Expenses: Not on file  Food Insecurity:   . Worried About Charity fundraiser in the Last Year: Not on file  . Ran Out of Food in the Last Year: Not on file  Transportation Needs:   . Lack of Transportation (Medical): Not on file  . Lack of Transportation (Non-Medical): Not on file  Physical Activity:   . Days of Exercise per Week: Not on file  . Minutes of Exercise per Session: Not on file  Stress:   . Feeling of Stress : Not on file  Social Connections:   . Frequency of Communication with Friends and Family: Not on file  . Frequency of Social Gatherings with Friends and Family: Not on file  . Attends Religious Services: Not on file  . Active Member of Clubs or Organizations: Not on file  . Attends Archivist Meetings: Not on file  . Marital Status: Not on file   Intimate Partner Violence:   . Fear of Current or Ex-Partner: Not on file  . Emotionally Abused: Not on file  . Physically Abused: Not on file  . Sexually Abused: Not on file   Health Maintenance  Topic Date Due  . COVID-19 Vaccine (1) Never done  . URINE MICROALBUMIN  07/28/2014  . MAMMOGRAM  01/25/2017  . TETANUS/TDAP  05/24/2018  . INFLUENZA VACCINE  06/12/2020  . PAP SMEAR-Modifier  08/03/2022  . COLONOSCOPY  05/09/2027  . Hepatitis C Screening  Completed  . HIV Screening  Completed    The following portions of the patient's history were reviewed and updated as appropriate:  She  has a past medical history of Anxiety, Fibroid, Hyperlipidemia, and Hypertension. She does not have any pertinent problems on file. She  has a past surgical history that includes Breast biopsy (1981). Her family history includes Alzheimer's disease in her mother and another family member; Diabetes in her father; Hypertension in her father, mother, and sister; Thyroid disease in her sister. She  reports that she has never smoked. She has never used smokeless tobacco. She reports current alcohol use. She reports that she does not use drugs. She has a current medication list which includes the following  prescription(s): amlodipine, ascorbic acid, metoprolol, and multivitamin. Current Outpatient Medications on File Prior to Visit  Medication Sig Dispense Refill  . Ascorbic Acid (VITAMIN C PO) Take by mouth daily.      . Multiple Vitamin (MULTIVITAMIN) tablet Take 1 tablet by mouth daily.       No current facility-administered medications on file prior to visit.   She has No Known Allergies..  Review of Systems Review of Systems  Constitutional: Negative for activity change, appetite change and fatigue.  HENT: Negative for hearing loss, congestion, tinnitus and ear discharge.  dentist q31m Eyes: Negative for visual disturbance (see optho q1y -- vision corrected to 20/20 with glasses).  Respiratory:  Negative for cough, chest tightness and shortness of breath.   Cardiovascular: Negative for chest pain, palpitations and leg swelling.  Gastrointestinal: Negative for abdominal pain, diarrhea, constipation and abdominal distention.  Genitourinary: Negative for urgency, frequency, decreased urine volume and difficulty urinating.  Musculoskeletal: Negative for back pain, arthralgias and gait problem.  Skin: Negative for color change, pallor and rash.  Neurological: Negative for dizziness, light-headedness, numbness and headaches.  Hematological: Negative for adenopathy. Does not bruise/bleed easily.  Psychiatric/Behavioral: Negative for suicidal ideas, confusion, sleep disturbance, self-injury, dysphoric mood, decreased concentration and agitation.       Objective:    BP 100/78 (BP Location: Right Arm, Patient Position: Sitting, Cuff Size: Large)   Pulse 60   Resp 18   Ht 5\' 2"  (1.575 m)   Wt 195 lb 3.2 oz (88.5 kg)   LMP 02/26/2014   SpO2 98%   BMI 35.70 kg/m  General appearance: alert, cooperative, appears stated age and no distress Head: Normocephalic, without obvious abnormality, atraumatic Eyes: negative findings: lids and lashes normal, conjunctivae and sclerae normal and pupils equal, round, reactive to light and accomodation Ears: normal TM's and external ear canals both ears Neck: no adenopathy, no carotid bruit, no JVD, supple, symmetrical, trachea midline and thyroid not enlarged, symmetric, no tenderness/mass/nodules Back: symmetric, no curvature. ROM normal. No CVA tenderness. Lungs: clear to auscultation bilaterally Breasts: gyn Heart: regular rate and rhythm, S1, S2 normal, no murmur, click, rub or gallop Abdomen: soft, non-tender; bowel sounds normal; no masses,  no organomegaly Pelvic: deferred--gyn Extremities: extremities normal, atraumatic, no cyanosis or edema Pulses: 2+ and symmetric Skin: Skin color, texture, turgor normal. No rashes or lesions Lymph nodes:  Cervical, supraclavicular, and axillary nodes normal. Neurologic: Alert and oriented X 3, normal strength and tone. Normal symmetric reflexes. Normal coordination and gait    Assessment:    Healthy female exam.      Plan:     ghm utd checklabs  See After Visit Summary for Counseling Recommendations \  1. Preventative health care See above  - Lipid panel - TSH - Comprehensive metabolic panel - CBC with Differential/Platelet - Microalbumin / creatinine urine ratio - POCT Urinalysis Dipstick (Automated)  2. Essential hypertension Well controlled, no changes to meds. Encouraged heart healthy diet such as the DASH diet and exercise as tolerated.  - Lipid panel - TSH - Comprehensive metabolic panel - CBC with Differential/Platelet - metoprolol (TOPROL-XL) 200 MG 24 hr tablet; Take 1 tablet (200 mg total) by mouth daily.  Dispense: 90 tablet; Refill: 1 - amLODipine (NORVASC) 10 MG tablet; Take 1 tablet (10 mg total) by mouth daily.  Dispense: 90 tablet; Refill: 3 - Microalbumin / creatinine urine ratio - POCT Urinalysis Dipstick (Automated)  3. Dyslipidemia .Encouraged heart healthy diet, increase exercise, avoid trans fats, consider a krill  oil cap daily - Lipid panel - Comprehensive metabolic panel - Microalbumin / creatinine urine ratio - POCT Urinalysis Dipstick (Automated)  4. Rib pain on left side  - DG Ribs Unilateral W/Chest Left; Future  5. Leukocytes in urine   - Urine Culture

## 2020-07-08 LAB — URINE CULTURE
MICRO NUMBER:: 10876290
SPECIMEN QUALITY:: ADEQUATE

## 2020-07-08 LAB — MICROALBUMIN / CREATININE URINE RATIO
Creatinine, Urine: 149 mg/dL (ref 20–275)
Microalb Creat Ratio: 2 mcg/mg creat (ref <30)
Microalb, Ur: 0.3 mg/dL

## 2020-08-01 ENCOUNTER — Other Ambulatory Visit: Payer: Self-pay | Admitting: Family Medicine

## 2020-08-01 DIAGNOSIS — Z1231 Encounter for screening mammogram for malignant neoplasm of breast: Secondary | ICD-10-CM

## 2020-08-02 ENCOUNTER — Ambulatory Visit
Admission: RE | Admit: 2020-08-02 | Discharge: 2020-08-02 | Disposition: A | Discharge status: Home / Self Care | Payer: 59 | Source: Ambulatory Visit | Attending: Family Medicine | Admitting: Family Medicine

## 2020-08-02 ENCOUNTER — Other Ambulatory Visit: Payer: Self-pay

## 2020-08-02 DIAGNOSIS — Z1231 Encounter for screening mammogram for malignant neoplasm of breast: Secondary | ICD-10-CM

## 2020-08-09 ENCOUNTER — Other Ambulatory Visit: Payer: Self-pay

## 2020-08-09 ENCOUNTER — Ambulatory Visit (INDEPENDENT_AMBULATORY_CARE_PROVIDER_SITE_OTHER): Payer: 59 | Admitting: Nurse Practitioner

## 2020-08-09 ENCOUNTER — Encounter: Payer: Self-pay | Admitting: Nurse Practitioner

## 2020-08-09 VITALS — BP 116/74 | Ht 63.0 in | Wt 181.0 lb

## 2020-08-09 DIAGNOSIS — Z01419 Encounter for gynecological examination (general) (routine) without abnormal findings: Secondary | ICD-10-CM | POA: Diagnosis not present

## 2020-08-09 DIAGNOSIS — R350 Frequency of micturition: Secondary | ICD-10-CM | POA: Diagnosis not present

## 2020-08-09 DIAGNOSIS — Z780 Asymptomatic menopausal state: Secondary | ICD-10-CM

## 2020-08-09 NOTE — Patient Instructions (Signed)

## 2020-08-09 NOTE — Progress Notes (Signed)
   Jodi Rodriguez 1961-02-27 [id number]   History:  59 y.o. G2P2 presents for annual exam. Postmenopausal - no HRT, no bleeding. Occasional hot flash. Normal pap and mammogram history. Occasional urinary frequency, denies dysuria, urgency, and hematuria.   Gynecologic History Patient's last menstrual period was 02/26/2014.   Contraception: post menopausal status Last Pap: 08/04/2019. Results were: normal Last mammogram: 08/02/2020. Results were: normal Last colonoscopy: 05/08/2017. Results were: normal Last Dexa: Never  Past medical history, past surgical history, family history and social history were all reviewed and documented in the EPIC chart.  ROS:  A ROS was performed and pertinent positives and negatives are included.  Exam:  Vitals:   08/09/20 0857  BP: 116/74  Weight: 181 lb (82.1 kg)  Height: 5\' 3"  (1.6 m)   Body mass index is 32.06 kg/m.  General appearance:  Normal Thyroid:  Symmetrical, normal in size, without palpable masses or nodularity. Respiratory  Auscultation:  Clear without wheezing or rhonchi Cardiovascular  Auscultation:  Regular rate, without rubs, murmurs or gallops  Edema/varicosities:  Not grossly evident Abdominal  Soft,nontender, without masses, guarding or rebound.  Liver/spleen:  No organomegaly noted  Hernia:  None appreciated  Skin  Inspection:  Grossly normal   Breasts: Examined lying and sitting.   Right: Without masses, retractions, discharge or axillary adenopathy.   Left: Without masses, retractions, discharge or axillary adenopathy. Gentitourinary   Inguinal/mons:  Normal without inguinal adenopathy  External genitalia:  Normal  BUS/Urethra/Skene's glands:  Normal  Vagina:  Normal  Cervix:  Normal  Uterus:  Normal in size, shape and contour.  Midline and mobile  Adnexa/parametria:     Rt: Without masses or tenderness.   Lt: Without masses or tenderness.  Anus and perineum: Normal  Digital rectal exam: Normal sphincter  tone without palpated masses or tenderness  Assessment/Plan:  59 y.o. G2P2 for annual exam.   Well female exam with routine gynecological exam - Education provided on SBEs, importance of preventative screenings, current guidelines, high calcium diet, regular exercise, and multivitamin daily. Labs done with primary care.  Postmenopausal - no HRT, no bleeding. Occasional hot flash  Screening for cervical cancer - normal pap history. Most recent 07/2019. Will repeat at 5 year interval.   Screening for osteoporosis - will plan for DEXA at age 59. Low risk.   Urinary frequency - Plan: Urinalysis w microscopic + reflex culture.   Follow up in 1 year for annual.      [place], 9:12 AM 08/09/2020

## 2020-08-10 LAB — URINALYSIS W MICROSCOPIC + REFLEX CULTURE
Bacteria, UA: NONE SEEN /HPF
Bilirubin Urine: NEGATIVE
Glucose, UA: NEGATIVE
Hgb urine dipstick: NEGATIVE
Hyaline Cast: NONE SEEN /LPF
Leukocyte Esterase: NEGATIVE
Nitrites, Initial: NEGATIVE
Protein, ur: NEGATIVE
RBC / HPF: NONE SEEN /HPF (ref 0–2)
Specific Gravity, Urine: 1.021 (ref 1.001–1.03)
WBC, UA: NONE SEEN /HPF (ref 0–5)
pH: 5.5 (ref 5.0–8.0)

## 2020-08-10 LAB — NO CULTURE INDICATED

## 2021-03-27 ENCOUNTER — Other Ambulatory Visit: Payer: Self-pay | Admitting: Family Medicine

## 2021-03-27 DIAGNOSIS — I10 Essential (primary) hypertension: Secondary | ICD-10-CM

## 2021-07-01 ENCOUNTER — Other Ambulatory Visit: Payer: Self-pay | Admitting: Family Medicine

## 2021-07-01 DIAGNOSIS — I10 Essential (primary) hypertension: Secondary | ICD-10-CM

## 2021-08-15 ENCOUNTER — Other Ambulatory Visit: Payer: Self-pay

## 2021-08-15 ENCOUNTER — Ambulatory Visit (INDEPENDENT_AMBULATORY_CARE_PROVIDER_SITE_OTHER): Payer: No Typology Code available for payment source | Admitting: Nurse Practitioner

## 2021-08-15 ENCOUNTER — Encounter: Payer: Self-pay | Admitting: Nurse Practitioner

## 2021-08-15 VITALS — BP 124/82 | Ht 62.0 in | Wt 194.0 lb

## 2021-08-15 DIAGNOSIS — Z780 Asymptomatic menopausal state: Secondary | ICD-10-CM

## 2021-08-15 DIAGNOSIS — Z01419 Encounter for gynecological examination (general) (routine) without abnormal findings: Secondary | ICD-10-CM | POA: Diagnosis not present

## 2021-08-15 NOTE — Progress Notes (Signed)
   Jodi Rodriguez [birth year] [id number]   History:  60 y.o. G2P2 presents for annual exam. No GYN complaints. Postmenopausal - no HRT, no bleeding. Occasional hot flashes. Normal pap and mammogram history. HTN, HLD managed by PCP.   Gynecologic History Patient's last menstrual period was 02/26/2014.   Contraception: post menopausal status  Health maintenance Last Pap: 08/04/2019. Results were: normal, 5-year repeat Last mammogram: 08/02/2020. Results were: normal Last colonoscopy: 05/08/2017. Results were: normal, 10-year recall Last Dexa: Never  Past medical history, past surgical history, family history and social history were all reviewed and documented in the EPIC chart. Married. Retired from [employer]er, [employer]. Working FT now. 42 and 74 yo children.   ROS:  A ROS was performed and pertinent positives and negatives are included.  Exam:  Vitals:   08/15/21 0937  BP: 124/82  Weight: 194 lb (88 kg)  Height: 5\' 2"  (1.575 m)    Body mass index is 35.48 kg/m.  General appearance:  Normal Thyroid:  Symmetrical, normal in size, without palpable masses or nodularity. Respiratory  Auscultation:  Clear without wheezing or rhonchi Cardiovascular  Auscultation:  Regular rate, without rubs, murmurs or gallops  Edema/varicosities:  Not grossly evident Abdominal  Soft,nontender, without masses, guarding or rebound.  Liver/spleen:  No organomegaly noted  Hernia:  None appreciated  Skin  Inspection:  Grossly normal   Breasts: Examined lying and sitting.   Right: Without masses, retractions, discharge or axillary adenopathy.   Left: Without masses, retractions, discharge or axillary adenopathy. Genitourinary   Inguinal/mons:  Normal without inguinal adenopathy  External genitalia:  Normal appearing vulva with no masses, tenderness, or lesions  BUS/Urethra/Skene's glands:  Normal  Vagina:  Normal appearing with normal color and discharge, no lesions  Cervix:  Normal appearing  without discharge or lesions  Uterus:  Difficult to palpate due to body habitus but no gross masses or tenderness  Adnexa/parametria:     Rt: Normal in size, without masses or tenderness.   Lt: Normal in size, without masses or tenderness.  Anus and perineum: Normal  Digital rectal exam: Normal sphincter tone without palpated masses or tenderness  Patient informed chaperone available to be present for breast and pelvic exam. Patient has requested no chaperone to be present. Patient has been advised what will be completed during breast and pelvic exam.   Assessment/Plan:  60 y.o. G2P2 for annual exam.   Well female exam with routine gynecological exam - Education provided on SBEs, importance of preventative screenings, current guidelines, high calcium diet, regular exercise, and multivitamin daily.  Labs with PCP.   Postmenopausal - no HRT, no bleeding.   Screening for cervical cancer - Normal Pap history.  Will repeat at 5-year interval per guidelines.  Screening for breast cancer - Normal mammogram history.  Continue annual screenings. Plans to schedule mammogram soon. Normal breast exam today.  Screening for colon cancer - 2018 colonoscopy. Will repeat at GI's recommended interval.   Screening for osteoporosis - Average risk. Will plan Dexa at age 45.   Follow up in 1 year for annual.      Jodi Rodriguez [facility], 9:44 AM 08/15/2021

## 2021-08-30 ENCOUNTER — Other Ambulatory Visit: Payer: Self-pay | Admitting: Family Medicine

## 2021-08-30 DIAGNOSIS — I10 Essential (primary) hypertension: Secondary | ICD-10-CM

## 2021-09-19 ENCOUNTER — Ambulatory Visit (INDEPENDENT_AMBULATORY_CARE_PROVIDER_SITE_OTHER): Payer: No Typology Code available for payment source | Admitting: Family Medicine

## 2021-09-19 ENCOUNTER — Other Ambulatory Visit: Payer: Self-pay

## 2021-09-19 ENCOUNTER — Encounter: Payer: Self-pay | Admitting: Family Medicine

## 2021-09-19 VITALS — BP 110/88 | HR 65 | Temp 98.8°F | Resp 16 | Ht 62.0 in | Wt 196.8 lb

## 2021-09-19 DIAGNOSIS — R10816 Epigastric abdominal tenderness: Secondary | ICD-10-CM

## 2021-09-19 DIAGNOSIS — I10 Essential (primary) hypertension: Secondary | ICD-10-CM | POA: Diagnosis not present

## 2021-09-19 DIAGNOSIS — E785 Hyperlipidemia, unspecified: Secondary | ICD-10-CM | POA: Diagnosis not present

## 2021-09-19 DIAGNOSIS — Z0000 Encounter for general adult medical examination without abnormal findings: Secondary | ICD-10-CM

## 2021-09-19 LAB — POC URINALSYSI DIPSTICK (AUTOMATED)
Bilirubin, UA: NEGATIVE
Blood, UA: NEGATIVE
Glucose, UA: NEGATIVE
Ketones, UA: NEGATIVE
Leukocytes, UA: NEGATIVE
Nitrite, UA: NEGATIVE
Protein, UA: NEGATIVE
Spec Grav, UA: >=1.03 — AB (ref 1.010–1.025)
Urobilinogen, UA: 0.2 E.U./dL
pH, UA: 5 (ref 5.0–8.0)

## 2021-09-19 LAB — MICROALBUMIN / CREATININE URINE RATIO
Creatinine,U: 264.1 mg/dL
Microalb Creat Ratio: 0.7 mg/g (ref 0.0–30.0)
Microalb, Ur: 1.8 mg/dL (ref 0.0–1.9)

## 2021-09-19 MED ORDER — AMLODIPINE BESYLATE 10 MG PO TABS: 10.0000 mg | ORAL_TABLET | Freq: Every day | ORAL | 1 refills | Status: DC

## 2021-09-19 MED ORDER — FAMOTIDINE 20 MG PO TABS
20.0000 mg | ORAL_TABLET | Freq: Two times a day (BID) | ORAL | 1 refills | Status: DC
Start: 2021-09-19 — End: 2021-11-27

## 2021-09-19 MED ORDER — METOPROLOL SUCCINATE ER 200 MG PO TB24: 200.0000 mg | ORAL_TABLET | Freq: Every day | ORAL | 1 refills | Status: DC

## 2021-09-19 NOTE — Progress Notes (Signed)
Subjective:   By signing my name below, I, Shehryar Baig, attest that this documentation has been prepared under the direction and in the presence of Dr. Roma Schanz, DO. 09/19/2021    Patient ID: Jodi Rodriguez, female    DOB: 1961/03/02, 60 y.o.   MRN: [id number]  Chief Complaint  Patient presents with   Annual Exam    Pt states fasting     HPI Patient is in today for a comprehensive physical exam.   Abdominal pain- She continues having pain in her left upper abdomen for the past year. The pain comes and goes. The pain is under her left ribs. The pain worsens or starts when laying on her right side. She does not notice if spicy or fatty foods worsen her symptoms. She denies frequent burping. She has tried Rolaids and Tums to manage her pain and found no relief.  GYN- she continues seeing a GYN specialist at this time. She is planning to schedule a mammogram appointment with her GYN specialist.  Hypertension- She continues taking 200 mg metoprolol daily PO, 10 mg amlodipine daily PO and reports no new issues while taking them. She is requesting a refill on them as well.   BP Readings from Last 3 Encounters:  09/19/21 110/88  08/15/21 124/82  08/09/20 116/74   Pulse Readings from Last 3 Encounters:  09/19/21 65  07/07/20 60  06/08/19 68   CPE She denies having any fever, new moles, congestion, sore throat, new muscle pain, new joint pain, chest pain, cough, SOB, wheezing, n/v/d, constipation, blood in stool, dysuria, frequency, hematuria, or headaches at this time. Social history- She has no recent changes to her family medical history. She has no recent surgical procedures.  Immunizations- She has 2 Hotel manager vaccines. She was informed of the bivalent Covid-19 vaccine. She does not have a flu vaccine. She is due for shingles vaccine and is not interested in receiving it. She is due for tetanus vaccine and is not interested in receiving it.  Exercise- She  exercises 2x weekly by walking.  Lab work- She is interested in completing lab work during this visit.    Past Medical History:  Diagnosis Date   Anxiety    Fibroid    98 WK SIZE UT   Hyperlipidemia    Hypertension     Past Surgical History:  Procedure Laterality Date   BREAST BIOPSY  1981   BREAST BIOPSY    Family History  Problem Relation Age of Onset   Thyroid disease Sister    Hypertension Sister    Diabetes Father    Hypertension Father    Hypertension Mother    Alzheimer's disease Mother    Alzheimer's disease Other        and neurological disorder    Social History   Socioeconomic History   Marital status: Married    Spouse name: Not on file   Number of children: Not on file   Years of education: Not on file   Highest education level: Not on file  Occupational History   Occupation: Insurance  Tobacco Use   Smoking status: Never   Smokeless tobacco: Never  Vaping Use   Vaping Use: Never used  Substance and Sexual Activity   Alcohol use: Yes    Comment: occas   Drug use: No   Sexual activity: Yes    Partners: Male    Birth control/protection: Other-see comments    Comment: vasectomy- fewer than 5 partners first sexual  active at age 8  Other Topics Concern   Not on file  Social History Narrative   Exercise-- 2x a week -- walking    Social Determinants of Health   Financial Resource Strain: Not on file  Food Insecurity: Not on file  Transportation Needs: Not on file  Physical Activity: Not on file  Stress: Not on file  Social Connections: Not on file  Intimate Partner Violence: Not on file    Outpatient Medications Prior to Visit  Medication Sig Dispense Refill   Ascorbic Acid (VITAMIN C PO) Take by mouth daily.       Multiple Vitamin (MULTIVITAMIN) tablet Take 1 tablet by mouth daily.       amLODipine (NORVASC) 10 MG tablet TAKE 1 TABLET BY MOUTH EVERY DAY 90 tablet 3   metoprolol (TOPROL-XL) 200 MG 24 hr tablet TAKE 1 TABLET BY MOUTH  EVERY DAY 30 tablet 0   No facility-administered medications prior to visit.    No Known Allergies  Review of Systems  Constitutional:  Negative for chills, fever and malaise/fatigue.  HENT:  Negative for congestion, hearing loss and sore throat.   Eyes:  Negative for blurred vision and discharge.  Respiratory:  Negative for cough, sputum production, shortness of breath and wheezing.   Cardiovascular:  Negative for chest pain, palpitations and leg swelling.  Gastrointestinal:  Negative for abdominal pain, blood in stool, constipation, diarrhea, heartburn, nausea and vomiting.       (-)Frequent belching  Genitourinary:  Negative for dysuria, frequency, hematuria and urgency.  Musculoskeletal:  Negative for back pain, falls, joint pain and myalgias.       (+)left upper abdomen around left rib  Skin:  Negative for rash.       (-)New moles  Neurological:  Negative for dizziness, sensory change, loss of consciousness, weakness and headaches.  Endo/Heme/Allergies:  Negative for environmental allergies. Does not bruise/bleed easily.  Psychiatric/Behavioral:  Negative for depression and suicidal ideas. The patient is not nervous/anxious and does not have insomnia.       Objective:    Physical Exam Vitals and nursing note reviewed.  Constitutional:      General: She is not in acute distress.    Appearance: Normal appearance. She is well-developed. She is not ill-appearing.  HENT:     Head: Normocephalic and atraumatic.     Right Ear: Tympanic membrane, ear canal and external ear normal.     Left Ear: Tympanic membrane, ear canal and external ear normal.  Eyes:     Extraocular Movements: Extraocular movements intact.     Conjunctiva/sclera: Conjunctivae normal.     Pupils: Pupils are equal, round, and reactive to light.  Neck:     Thyroid: No thyromegaly.     Vascular: No carotid bruit or JVD.  Cardiovascular:     Rate and Rhythm: Normal rate and regular rhythm.     Heart sounds:  Normal heart sounds. No murmur heard.   No gallop.  Pulmonary:     Effort: Pulmonary effort is normal. No respiratory distress.     Breath sounds: Normal breath sounds. No wheezing or rales.  Chest:     Chest wall: No tenderness.  Abdominal:     General: Bowel sounds are normal. There is no distension.     Palpations: Abdomen is soft.     Tenderness: There is no abdominal tenderness. There is no guarding.  Musculoskeletal:        General: Normal range of motion.  Cervical back: Normal range of motion and neck supple.  Skin:    General: Skin is warm and dry.  Neurological:     General: No focal deficit present.     Mental Status: She is alert and oriented to person, place, and time. Mental status is at baseline.  Psychiatric:        Mood and Affect: Mood normal.        Behavior: Behavior normal.        Thought Content: Thought content normal.        Judgment: Judgment normal.    BP 110/88 (BP Location: Left Arm, Patient Position: Sitting, Cuff Size: Large)   Pulse 65   Temp 98.8 F (37.1 C) (Oral)   Resp 16   Ht 5\' 2"  (1.575 m)   Wt 196 lb 12.8 oz (89.3 kg)   LMP 02/26/2014   SpO2 98%   BMI 36.00 kg/m  Wt Readings from Last 3 Encounters:  09/19/21 196 lb 12.8 oz (89.3 kg)  08/15/21 194 lb (88 kg)  08/09/20 181 lb (82.1 kg)    Diabetic Foot Exam - Simple   No data filed    Lab Results  Component Value Date   WBC 5.6 07/07/2020   HGB 13.2 07/07/2020   HCT 39.7 07/07/2020   PLT 277 07/07/2020   GLUCOSE 102 (H) 07/07/2020   CHOL 264 (H) 07/07/2020   TRIG 73 07/07/2020   HDL 63 07/07/2020   LDLDIRECT 151.1 07/28/2013   LDLCALC 184 (H) 07/07/2020   ALT 28 07/07/2020   AST 26 07/07/2020   NA 139 07/07/2020   K 4.1 07/07/2020   CL 103 07/07/2020   CREATININE 0.85 07/07/2020   BUN 13 07/07/2020   CO2 26 07/07/2020   TSH 1.29 07/07/2020   MICROALBUR 0.3 07/07/2020    Lab Results  Component Value Date   TSH 1.29 07/07/2020   Lab Results  Component  Value Date   WBC 5.6 07/07/2020   HGB 13.2 07/07/2020   HCT 39.7 07/07/2020   MCV 83.4 07/07/2020   PLT 277 07/07/2020   Lab Results  Component Value Date   NA 139 07/07/2020   K 4.1 07/07/2020   CO2 26 07/07/2020   GLUCOSE 102 (H) 07/07/2020   BUN 13 07/07/2020   CREATININE 0.85 07/07/2020   BILITOT 0.6 07/07/2020   ALKPHOS 61 06/08/2019   AST 26 07/07/2020   ALT 28 07/07/2020   PROT 7.5 07/07/2020   ALBUMIN 4.4 06/08/2019   CALCIUM 10.5 (H) 07/07/2020   GFR 81.55 06/08/2019   Lab Results  Component Value Date   CHOL 264 (H) 07/07/2020   Lab Results  Component Value Date   HDL 63 07/07/2020   Lab Results  Component Value Date   LDLCALC 184 (H) 07/07/2020   Lab Results  Component Value Date   TRIG 73 07/07/2020   Lab Results  Component Value Date   CHOLHDL 4.2 07/07/2020   No results found for: HGBA1C  Mammogram- Last completed 08/02/2021. Results are normal. Repeat in 1 year.  Colonoscopy- Last completed 06/11/2017. Results are normal. Repeat in 10 years.  Pap Smear- Last completed 08/04/2019.      Assessment & Plan:   Problem List Items Addressed This Visit       Unprioritized   Epigastric abdominal tenderness without rebound tenderness    pepcid bid [place] abd Check labs Consider gi referral      Relevant Medications   famotidine (PEPCID) 20 MG tablet  Other Relevant Orders   US Abdomen Complete   H. pylori antibody, IgG   Essential hypertension    Well controlled, no changes to meds. Encouraged heart healthy diet such as the DASH diet and exercise as tolerated.       Relevant Medications   metoprolol (TOPROL-XL) 200 MG 24 hr tablet   amLODipine (NORVASC) 10 MG tablet   Other Relevant Orders   Lipid panel   Comprehensive metabolic panel   CBC with Differential/Platelet   POCT Urinalysis Dipstick (Automated) (Completed)   Microalbumin / creatinine urine ratio   Hyperlipidemia    Encourage heart healthy diet such as MIND or DASH diet,  increase exercise, avoid trans fats, simple carbohydrates and processed foods, consider a krill or fish or flaxseed oil cap daily.       Relevant Medications   metoprolol (TOPROL-XL) 200 MG 24 hr tablet   amLODipine (NORVASC) 10 MG tablet   Preventative health care - Primary    ghm utd Check labs  See avs      Relevant Medications   metoprolol (TOPROL-XL) 200 MG 24 hr tablet   amLODipine (NORVASC) 10 MG tablet   Other Relevant Orders   Lipid panel   Comprehensive metabolic panel   CBC with Differential/Platelet   TSH     Meds ordered this encounter  Medications   metoprolol (TOPROL-XL) 200 MG 24 hr tablet    Sig: Take 1 tablet (200 mg total) by mouth daily.    Dispense:  90 tablet    Refill:  1   amLODipine (NORVASC) 10 MG tablet    Sig: Take 1 tablet (10 mg total) by mouth daily.    Dispense:  90 tablet    Refill:  1   famotidine (PEPCID) 20 MG tablet    Sig: Take 1 tablet (20 mg total) by mouth 2 (two) times daily.    Dispense:  60 tablet    Refill:  1    I, Dr. Roma Schanz, DO, personally preformed the services described in this documentation.  All medical record entries made by the scribe were at my direction and in my presence.  I have reviewed the chart and discharge instructions (if applicable) and agree that the record reflects my personal performance and is accurate and complete. 09/19/2021   I,Shehryar Baig,acting as a scribe for Ann Held, DO.,have documented all relevant documentation on the behalf of Ann Held, DO,as directed by  Ann Held, DO while in the presence of Ann Held, DO.   Ann Held, DO

## 2021-09-19 NOTE — Assessment & Plan Note (Signed)
Well controlled, no changes to meds. Encouraged heart healthy diet such as the DASH diet and exercise as tolerated.  °

## 2021-09-19 NOTE — Assessment & Plan Note (Signed)
Encourage heart healthy diet such as MIND or DASH diet, increase exercise, avoid trans fats, simple carbohydrates and processed foods, consider a krill or fish or flaxseed oil cap daily.  °

## 2021-09-19 NOTE — Patient Instructions (Signed)
Preventive Care 92-57 Years Old, Female Preventive care refers to lifestyle choices and visits with your health care provider that can promote health and wellness. Preventive care visits are also called wellness exams. What can I expect for my preventive care visit? Counseling Your health care provider may ask you questions about your: Medical history, including: Past medical problems. Family medical history. Pregnancy history. Current health, including: Menstrual cycle. Method of birth control. Emotional well-being. Home life and relationship well-being. Sexual activity and sexual health. Lifestyle, including: Alcohol, nicotine or tobacco, and drug use. Access to firearms. Diet, exercise, and sleep habits. Work and work Statistician. Sunscreen use. Safety issues such as seatbelt and bike helmet use. Physical exam Your health care provider will check your: Height and weight. These may be used to calculate your BMI (body mass index). BMI is a measurement that tells if you are at a healthy weight. Waist circumference. This measures the distance around your waistline. This measurement also tells if you are at a healthy weight and may help predict your risk of certain diseases, such as type 2 diabetes and high blood pressure. Heart rate and blood pressure. Body temperature. Skin for abnormal spots. What immunizations do I need? Vaccines are usually given at various ages, according to a schedule. Your health care provider will recommend vaccines for you based on your age, medical history, and lifestyle or other factors, such as travel or where you work. What tests do I need? Screening Your health care provider may recommend screening tests for certain conditions. This may include: Lipid and cholesterol levels. Diabetes screening. This is done by checking your blood sugar (glucose) after you have not eaten for a while (fasting). Pelvic exam and Pap test. Hepatitis B test. Hepatitis C  test. HIV (human immunodeficiency virus) test. STI (sexually transmitted infection) testing, if you are at risk. Lung cancer screening. Colorectal cancer screening. Mammogram. Talk with your health care provider about when you should start having regular mammograms. This may depend on whether you have a family history of breast cancer. BRCA-related cancer screening. This may be done if you have a family history of breast, ovarian, tubal, or peritoneal cancers. Bone density scan. This is done to screen for osteoporosis. Talk with your health care provider about your test results, treatment options, and if necessary, the need for more tests. Follow these instructions at home: Eating and drinking  Eat a diet that includes fresh fruits and vegetables, whole grains, lean protein, and low-fat dairy products. Take vitamin and mineral supplements as recommended by your health care provider. Do not drink alcohol if: Your health care provider tells you not to drink. You are pregnant, may be pregnant, or are planning to become pregnant. If you drink alcohol: Limit how much you have to 0-1 drink a day. Know how much alcohol is in your drink. In the [place] one drink equals one 12 oz bottle of beer (355 mL), one 5 oz glass of wine (148 mL), or one 1 oz glass of hard liquor (44 mL). Lifestyle Brush your teeth every morning and night with fluoride toothpaste. Floss one time each day. Exercise for at least 30 minutes 5 or more days each week. Do not use any products that contain nicotine or tobacco. These products include cigarettes, chewing tobacco, and vaping devices, such as e-cigarettes. If you need help quitting, ask your health care provider. Do not use drugs. If you are sexually active, practice safe sex. Use a condom or other form of protection to prevent  STIs. If you do not wish to become pregnant, use a form of birth control. If you plan to become pregnant, see your health care provider for a  prepregnancy visit. Take aspirin only as told by your health care provider. Make sure that you understand how much to take and what form to take. Work with your health care provider to find out whether it is safe and beneficial for you to take aspirin daily. Find healthy ways to manage stress, such as: Meditation, yoga, or listening to music. Journaling. Talking to a trusted person. Spending time with friends and family. Minimize exposure to UV radiation to reduce your risk of skin cancer. Safety Always wear your seat belt while driving or riding in a vehicle. Do not drive: If you have been drinking alcohol. Do not ride with someone who has been drinking. When you are tired or distracted. While texting. If you have been using any mind-altering substances or drugs. Wear a helmet and other protective equipment during sports activities. If you have firearms in your house, make sure you follow all gun safety procedures. Seek help if you have been physically or sexually abused. What's next? Visit your health care provider once a year for an annual wellness visit. Ask your health care provider how often you should have your eyes and teeth checked. Stay up to date on all vaccines. This information is not intended to replace advice given to you by your health care provider. Make sure you discuss any questions you have with your health care provider. Document Revised: 04/26/2021 Document Reviewed: 04/26/2021 Elsevier Patient Education  [employer].

## 2021-09-19 NOTE — Assessment & Plan Note (Signed)
pepcid bid [place] abd Check labs Consider gi referral

## 2021-09-19 NOTE — Assessment & Plan Note (Signed)
ghm utd Check labs  See avs  

## 2021-09-20 ENCOUNTER — Telehealth: Payer: Self-pay

## 2021-09-20 LAB — COMPREHENSIVE METABOLIC PANEL
ALT: 17 U/L (ref 0–35)
AST: 20 U/L (ref 0–37)
Albumin: 4.7 g/dL (ref 3.5–5.2)
Alkaline Phosphatase: 66 U/L (ref 39–117)
BUN: 16 mg/dL (ref 6–23)
CO2: 28 mEq/L (ref 19–32)
Calcium: 9.5 mg/dL (ref 8.4–10.5)
Chloride: 106 mEq/L (ref 96–112)
Creatinine, Ser: 0.8 mg/dL (ref 0.40–1.20)
GFR: 81.39 mL/min (ref >60.00)
Glucose, Bld: 87 mg/dL (ref 70–99)
Potassium: 3.9 mEq/L (ref 3.5–5.1)
Sodium: 142 mEq/L (ref 135–145)
Total Bilirubin: 0.5 mg/dL (ref 0.2–1.2)
Total Protein: 7.8 g/dL (ref 6.0–8.3)

## 2021-09-20 LAB — LIPID PANEL
Cholesterol: 255 mg/dL — ABNORMAL HIGH (ref 0–200)
HDL: 63.5 mg/dL (ref >39.00)
LDL Cholesterol: 173 mg/dL — ABNORMAL HIGH (ref 0–99)
NonHDL: 191.83
Total CHOL/HDL Ratio: 4
Triglycerides: 94 mg/dL (ref 0.0–149.0)
VLDL: 18.8 mg/dL (ref 0.0–40.0)

## 2021-09-20 LAB — CBC WITH DIFFERENTIAL/PLATELET
Basophils Absolute: 0.1 10*3/uL (ref 0.0–0.1)
Basophils Relative: 1.2 % (ref 0.0–3.0)
Eosinophils Absolute: 0.1 10*3/uL (ref 0.0–0.7)
Eosinophils Relative: 2.5 % (ref 0.0–5.0)
HCT: 41 % (ref 36.0–46.0)
Hemoglobin: 13.2 g/dL (ref 12.0–15.0)
Lymphocytes Relative: 48.7 % — ABNORMAL HIGH (ref 12.0–46.0)
Lymphs Abs: 2.7 10*3/uL (ref 0.7–4.0)
MCHC: 32.2 g/dL (ref 30.0–36.0)
MCV: 85.4 fl (ref 78.0–100.0)
Monocytes Absolute: 0.3 10*3/uL (ref 0.1–1.0)
Monocytes Relative: 5.2 % (ref 3.0–12.0)
Neutro Abs: 2.4 10*3/uL (ref 1.4–7.7)
Neutrophils Relative %: 42.4 % — ABNORMAL LOW (ref 43.0–77.0)
Platelets: 242 10*3/uL (ref 150.0–400.0)
RBC: 4.8 Mil/uL (ref 3.87–5.11)
RDW: 13.9 % (ref 11.5–15.5)
WBC: 5.6 10*3/uL (ref 4.0–10.5)

## 2021-09-20 LAB — H. PYLORI ANTIBODY, IGG: H Pylori IgG: NEGATIVE

## 2021-09-20 LAB — TSH: TSH: 1.57 u[IU]/mL (ref 0.35–5.50)

## 2021-09-20 MED ORDER — ROSUVASTATIN CALCIUM 10 MG PO TABS: 10.0000 mg | ORAL_TABLET | Freq: Every evening | ORAL | 3 refills | Status: DC

## 2021-09-20 NOTE — Telephone Encounter (Signed)
-----   Message from Ann Held, DO sent at 09/20/2021  2:31 PM EST ----- Cholesterol--- LDL goal < 70,  HDL >40,  TG < 150.  Diet and exercise will increase HDL and decrease LDL and TG.  Fish,  Fish Oil, Flaxseed oil will also help increase the HDL and decrease Triglycerides.   Recheck labs in 3 months Should start meds---  crestor 10 mg 1 po qhs, 2 refills.

## 2021-09-20 NOTE — Telephone Encounter (Signed)
Spoke with pt regarding labs and instructions.   

## 2021-10-04 ENCOUNTER — Ambulatory Visit (HOSPITAL_BASED_OUTPATIENT_CLINIC_OR_DEPARTMENT_OTHER)
Admission: RE | Admit: 2021-10-04 | Discharge: 2021-10-04 | Disposition: A | Discharge status: Home / Self Care | Payer: No Typology Code available for payment source | Source: Ambulatory Visit | Attending: Family Medicine | Admitting: Family Medicine

## 2021-10-04 ENCOUNTER — Other Ambulatory Visit: Payer: Self-pay

## 2021-10-04 DIAGNOSIS — R10816 Epigastric abdominal tenderness: Secondary | ICD-10-CM | POA: Diagnosis present

## 2021-10-12 ENCOUNTER — Other Ambulatory Visit: Payer: Self-pay | Admitting: Family Medicine

## 2021-10-12 DIAGNOSIS — Z1231 Encounter for screening mammogram for malignant neoplasm of breast: Secondary | ICD-10-CM

## 2021-11-15 ENCOUNTER — Other Ambulatory Visit: Payer: Self-pay

## 2021-11-15 ENCOUNTER — Ambulatory Visit
Admission: RE | Admit: 2021-11-15 | Discharge: 2021-11-15 | Disposition: A | Discharge status: Home / Self Care | Payer: No Typology Code available for payment source | Source: Ambulatory Visit | Attending: Family Medicine | Admitting: Family Medicine

## 2021-11-15 DIAGNOSIS — Z1231 Encounter for screening mammogram for malignant neoplasm of breast: Secondary | ICD-10-CM

## 2021-11-25 ENCOUNTER — Other Ambulatory Visit: Payer: Self-pay | Admitting: Family Medicine

## 2021-11-25 DIAGNOSIS — R10816 Epigastric abdominal tenderness: Secondary | ICD-10-CM

## 2022-02-04 ENCOUNTER — Other Ambulatory Visit: Payer: Self-pay | Admitting: Family Medicine

## 2022-02-04 DIAGNOSIS — Z0000 Encounter for general adult medical examination without abnormal findings: Secondary | ICD-10-CM

## 2022-02-04 DIAGNOSIS — I10 Essential (primary) hypertension: Secondary | ICD-10-CM

## 2022-03-19 ENCOUNTER — Ambulatory Visit: Payer: No Typology Code available for payment source | Admitting: Family Medicine

## 2022-03-20 ENCOUNTER — Ambulatory Visit: Payer: No Typology Code available for payment source | Admitting: Family Medicine

## 2022-03-20 ENCOUNTER — Encounter: Payer: Self-pay | Admitting: Family Medicine

## 2022-03-20 VITALS — BP 110/80 | HR 56 | Temp 98.3°F | Resp 16 | Ht 61.0 in | Wt 208.4 lb

## 2022-03-20 DIAGNOSIS — R0683 Snoring: Secondary | ICD-10-CM

## 2022-03-20 DIAGNOSIS — E785 Hyperlipidemia, unspecified: Secondary | ICD-10-CM | POA: Diagnosis not present

## 2022-03-20 DIAGNOSIS — I10 Essential (primary) hypertension: Secondary | ICD-10-CM | POA: Diagnosis not present

## 2022-03-20 NOTE — Assessment & Plan Note (Signed)
Well controlled, no changes to meds. Encouraged heart healthy diet such as the DASH diet and exercise as tolerated.  °

## 2022-03-20 NOTE — Assessment & Plan Note (Signed)
Encourage heart healthy diet such as MIND or DASH diet, increase exercise, avoid trans fats, simple carbohydrates and processed foods, consider a krill or fish or flaxseed oil cap daily.  °

## 2022-03-20 NOTE — Progress Notes (Signed)
? ?Subjective:  ? ?By signing my name below, I, Shehryar Baig, attest that this documentation has been prepared under the direction and in the presence of Ann Held, DO. 03/20/2022 ? ? ? Patient ID: Jodi Rodriguez, female    DOB: 04-May-1961, 61 y.o.   MRN: [id number] ? ?Chief Complaint  ?Patient presents with  ? Follow-up  ?  Follow Up for hypelipemia / Medication   ? Hyperlipidemia  ? ? ?Hyperlipidemia ?Pertinent negatives include no chest pain or shortness of breath.  ?Patient is in today for a follow up visit.  ? ?Her husband informed her she snores while sleeping. She has stopped breathing as well while sleeping. She wakes up feeling tired but notes she typically felt this way. She finds she has more energy after sleeping when she is regularly exercising.  ?Her blood pressure is doing well during this visit. She continues taking 200 mg metoprolol-XL, 10 mg amlodipine, and reports no new issues while taking them. She denies having any swelling in her legs. She does occasionally developed swelling in her legs briefly.  ?BP Readings from Last 3 Encounters:  ?03/20/22 110/80  ?09/19/21 110/88  ?08/15/21 124/82  ? ?Pulse Readings from Last 3 Encounters:  ?03/20/22 (!) 56  ?09/19/21 65  ?07/07/20 60  ? ?She continues taking 10 mg Crestor and reports no new issues while taking it.  ?Lab Results  ?Component Value Date  ? CHOL 255 (H) 09/19/2021  ? HDL 63.50 09/19/2021  ? LDLCALC 173 (H) 09/19/2021  ? LDLDIRECT 151.1 07/28/2013  ? TRIG 94.0 09/19/2021  ? CHOLHDL 4 09/19/2021  ? ? ?Past Medical History:  ?Diagnosis Date  ? Anxiety   ? Fibroid   ? [place]  ? Hyperlipidemia   ? Hypertension   ? ? ?Past Surgical History:  ?Procedure Laterality Date  ? BREAST BIOPSY  1981  ? BREAST BIOPSY  ? ? ?Family History  ?Problem Relation Age of Onset  ? Thyroid disease Sister   ? Hypertension Sister   ? Diabetes Father   ? Hypertension Father   ? Hypertension Mother   ? Alzheimer's disease Mother   ? Alzheimer's  disease Other   ?     and neurological disorder  ? ? ?Social History  ? ?Socioeconomic History  ? Marital status: Married  ?  Spouse name: Not on file  ? Number of children: Not on file  ? Years of education: Not on file  ? Highest education level: Not on file  ?Occupational History  ? Occupation: Insurance  ?Tobacco Use  ? Smoking status: Never  ? Smokeless tobacco: Never  ?Vaping Use  ? Vaping Use: Never used  ?Substance and Sexual Activity  ? Alcohol use: Yes  ?  Comment: occas  ? Drug use: No  ? Sexual activity: Yes  ?  Partners: Male  ?  Birth control/protection: Other-see comments  ?  Comment: vasectomy- fewer than 5 partners first sexual  active at age 10  ?Other Topics Concern  ? Not on file  ?Social History Narrative  ? Exercise-- 2x a week -- walking   ? ?Social Determinants of Health  ? ?Financial Resource Strain: Not on file  ?Food Insecurity: Not on file  ?Transportation Needs: Not on file  ?Physical Activity: Not on file  ?Stress: Not on file  ?Social Connections: Not on file  ?Intimate Partner Violence: Not on file  ? ? ?Outpatient Medications Prior to Visit  ?Medication Sig Dispense Refill  ?  amLODipine (NORVASC) 10 MG tablet Take 1 tablet (10 mg total) by mouth daily. 90 tablet 1  ? Ascorbic Acid (VITAMIN C PO) Take by mouth daily.      ? famotidine (PEPCID) 20 MG tablet TAKE 1 TABLET BY MOUTH TWICE A DAY 180 tablet 1  ? metoprolol (TOPROL-XL) 200 MG 24 hr tablet TAKE 1 TABLET BY MOUTH EVERY DAY 90 tablet 1  ? Multiple Vitamin (MULTIVITAMIN) tablet Take 1 tablet by mouth daily.      ? rosuvastatin (CRESTOR) 10 MG tablet Take 1 tablet (10 mg total) by mouth at bedtime. 30 tablet 3  ? ?No facility-administered medications prior to visit.  ? ? ?No Known Allergies ? ?Review of Systems  ?Constitutional:  Negative for fever and malaise/fatigue.  ?HENT:  Negative for congestion.   ?Eyes:  Negative for blurred vision.  ?Respiratory:  Negative for shortness of breath.   ?Cardiovascular:  Negative for chest  pain, palpitations and leg swelling.  ?Gastrointestinal:  Negative for abdominal pain, blood in stool and nausea.  ?Genitourinary:  Negative for dysuria and frequency.  ?Musculoskeletal:  Negative for falls.  ?Skin:  Negative for rash.  ?Neurological:  Negative for dizziness, loss of consciousness and headaches.  ?Endo/Heme/Allergies:  Negative for environmental allergies.  ?Psychiatric/Behavioral:  Negative for depression. The patient is not nervous/anxious.   ? ?   ?Objective:  ?  ?Physical Exam ?Vitals and nursing note reviewed.  ?Constitutional:   ?   General: She is not in acute distress. ?   Appearance: Normal appearance. She is not ill-appearing.  ?HENT:  ?   Head: Normocephalic and atraumatic.  ?   Right Ear: External ear normal.  ?   Left Ear: External ear normal.  ?Eyes:  ?   Extraocular Movements: Extraocular movements intact.  ?   Pupils: Pupils are equal, round, and reactive to light.  ?Cardiovascular:  ?   Rate and Rhythm: Normal rate and regular rhythm.  ?   Heart sounds: Normal heart sounds. No murmur heard. ?  No gallop.  ?Pulmonary:  ?   Effort: Pulmonary effort is normal. No respiratory distress.  ?   Breath sounds: Normal breath sounds. No wheezing or rales.  ?Musculoskeletal:  ?   Right lower leg: No edema.  ?   Left lower leg: No edema.  ?Skin: ?   General: Skin is warm and dry.  ?Neurological:  ?   Mental Status: She is alert and oriented to person, place, and time.  ?Psychiatric:     ?   Judgment: Judgment normal.  ? ? ?BP 110/80 (BP Location: Right Arm, Patient Position: Sitting, Cuff Size: Normal)   Pulse (!) 56   Temp 98.3 ?F (36.8 ?C) (Oral)   Resp 16   Ht '5\' 1"'$  (1.549 m)   Wt 208 lb 6.4 oz (94.5 kg)   LMP 02/26/2014   SpO2 95%   BMI 39.38 kg/m?  ?Wt Readings from Last 3 Encounters:  ?03/20/22 208 lb 6.4 oz (94.5 kg)  ?09/19/21 196 lb 12.8 oz (89.3 kg)  ?08/15/21 194 lb (88 kg)  ? ? ?Diabetic Foot Exam - Simple   ?No data filed ?  ? ?Lab Results  ?Component Value Date  ? WBC 5.6  09/19/2021  ? HGB 13.2 09/19/2021  ? HCT 41.0 09/19/2021  ? PLT 242.0 09/19/2021  ? GLUCOSE 87 09/19/2021  ? CHOL 255 (H) 09/19/2021  ? TRIG 94.0 09/19/2021  ? HDL 63.50 09/19/2021  ? LDLDIRECT 151.1 07/28/2013  ? [place]  173 (H) 09/19/2021  ? ALT 17 09/19/2021  ? AST 20 09/19/2021  ? NA 142 09/19/2021  ? K 3.9 09/19/2021  ? CL 106 09/19/2021  ? CREATININE 0.80 09/19/2021  ? BUN 16 09/19/2021  ? CO2 28 09/19/2021  ? TSH 1.57 09/19/2021  ? MICROALBUR 1.8 09/19/2021  ? ? ?Lab Results  ?Component Value Date  ? TSH 1.57 09/19/2021  ? ?Lab Results  ?Component Value Date  ? WBC 5.6 09/19/2021  ? HGB 13.2 09/19/2021  ? HCT 41.0 09/19/2021  ? MCV 85.4 09/19/2021  ? PLT 242.0 09/19/2021  ? ?Lab Results  ?Component Value Date  ? NA 142 09/19/2021  ? K 3.9 09/19/2021  ? CO2 28 09/19/2021  ? GLUCOSE 87 09/19/2021  ? BUN 16 09/19/2021  ? CREATININE 0.80 09/19/2021  ? BILITOT 0.5 09/19/2021  ? ALKPHOS 66 09/19/2021  ? AST 20 09/19/2021  ? ALT 17 09/19/2021  ? PROT 7.8 09/19/2021  ? ALBUMIN 4.7 09/19/2021  ? CALCIUM 9.5 09/19/2021  ? GFR 81.39 09/19/2021  ? ?Lab Results  ?Component Value Date  ? CHOL 255 (H) 09/19/2021  ? ?Lab Results  ?Component Value Date  ? HDL 63.50 09/19/2021  ? ?Lab Results  ?Component Value Date  ? LDLCALC 173 (H) 09/19/2021  ? ?Lab Results  ?Component Value Date  ? TRIG 94.0 09/19/2021  ? ?Lab Results  ?Component Value Date  ? CHOLHDL 4 09/19/2021  ? ?No results found for: HGBA1C ? ?   ?Assessment & Plan:  ? ?Problem List Items Addressed This Visit   ? ?  ? Unprioritized  ? Hyperlipidemia  ?  Encourage heart healthy diet such as MIND or DASH diet, increase exercise, avoid trans fats, simple carbohydrates and processed foods, consider a krill or fish or flaxseed oil cap daily.  ? ?  ?  ? Relevant Orders  ? Comprehensive metabolic panel  ? Lipid panel  ? Essential hypertension  ?  Well controlled, no changes to meds. Encouraged heart healthy diet such as the DASH diet and exercise as tolerated.  ? ?  ?   ? ?Other Visit Diagnoses   ? ? Snoring    -  Primary  ? Relevant Orders  ? Ambulatory referral to Neurology  ? ?  ? ? ? ?No orders of the defined types were placed in this encounter. ? ? ?I, Ann Held, DO,

## 2022-03-20 NOTE — Patient Instructions (Signed)
Cholesterol Content in Foods ?Cholesterol is a waxy, fat-like substance that helps to carry fat in the blood. The body needs cholesterol in small amounts, but too much cholesterol can cause damage to the arteries and heart. ?What foods have cholesterol? ? ?Cholesterol is found in animal-based foods, such as meat, seafood, and dairy. Generally, low-fat dairy and lean meats have less cholesterol than full-fat dairy and fatty meats. The milligrams of cholesterol per serving (mg per serving) of common cholesterol-containing foods are listed below. ?Meats and other proteins ?Egg -- one large whole egg has 186 mg. ?Veal shank -- 4 oz (113 g) has 141 mg. ?Lean ground turkey (93% lean) -- 4 oz (113 g) has 118 mg. ?Fat-trimmed lamb loin -- 4 oz (113 g) has 106 mg. ?Lean ground beef (90% lean) -- 4 oz (113 g) has 100 mg. ?Lobster -- 3.5 oz (99 g) has 90 mg. ?Pork loin chops -- 4 oz (113 g) has 86 mg. ?Canned salmon -- 3.5 oz (99 g) has 83 mg. ?Fat-trimmed beef top loin -- 4 oz (113 g) has 78 mg. ?Frankfurter -- 1 frank (3.5 oz or 99 g) has 77 mg. ?Crab -- 3.5 oz (99 g) has 71 mg. ?Roasted chicken without skin, white meat -- 4 oz (113 g) has 66 mg. ?Light bologna -- 2 oz (57 g) has 45 mg. ?Deli-cut turkey -- 2 oz (57 g) has 31 mg. ?Canned tuna -- 3.5 oz (99 g) has 31 mg. ?Bacon -- 1 oz (28 g) has 29 mg. ?Oysters and mussels (raw) -- 3.5 oz (99 g) has 25 mg. ?Mackerel -- 1 oz (28 g) has 22 mg. ?Trout -- 1 oz (28 g) has 20 mg. ?Pork sausage -- 1 link (1 oz or 28 g) has 17 mg. ?Salmon -- 1 oz (28 g) has 16 mg. ?Tilapia -- 1 oz (28 g) has 14 mg. ?Dairy ?Soft-serve ice cream -- ? cup (4 oz or 86 g) has 103 mg. ?Whole-milk yogurt -- 1 cup (8 oz or 245 g) has 29 mg. ?Cheddar cheese -- 1 oz (28 g) has 28 mg. ?American cheese -- 1 oz (28 g) has 28 mg. ?Whole milk -- 1 cup (8 oz or 250 mL) has 23 mg. ?2% milk -- 1 cup (8 oz or 250 mL) has 18 mg. ?Cream cheese -- 1 tablespoon (Tbsp) (14.5 g) has 15 mg. ?Cottage cheese -- ? cup (4 oz or  113 g) has 14 mg. ?Low-fat (1%) milk -- 1 cup (8 oz or 250 mL) has 10 mg. ?Sour cream -- 1 Tbsp (12 g) has 8.5 mg. ?Low-fat yogurt -- 1 cup (8 oz or 245 g) has 8 mg. ?Nonfat Greek yogurt -- 1 cup (8 oz or 228 g) has 7 mg. ?Half-and-half cream -- 1 Tbsp (15 mL) has 5 mg. ?Fats and oils ?Cod liver oil -- 1 tablespoon (Tbsp) (13.6 g) has 82 mg. ?Butter -- 1 Tbsp (14 g) has 15 mg. ?Lard -- 1 Tbsp (12.8 g) has 14 mg. ?Bacon grease -- 1 Tbsp (12.9 g) has 14 mg. ?Mayonnaise -- 1 Tbsp (13.8 g) has 5-10 mg. ?Margarine -- 1 Tbsp (14 g) has 3-10 mg. ?The items listed above may not be a complete list of foods with cholesterol. Exact amounts of cholesterol in these foods may vary depending on specific ingredients and brands. Contact a dietitian for more information. ?What foods do not have cholesterol? ?Most plant-based foods do not have cholesterol unless you combine them with a food that has   cholesterol. Foods without cholesterol include: ?Grains and cereals. ?Vegetables. ?Fruits. ?Vegetable oils, such as olive, canola, and sunflower oil. ?Legumes, such as peas, beans, and lentils. ?Nuts and seeds. ?Egg whites. ?The items listed above may not be a complete list of foods that do not have cholesterol. Contact a dietitian for more information. ?Summary ?The body needs cholesterol in small amounts, but too much cholesterol can cause damage to the arteries and heart. ?Cholesterol is found in animal-based foods, such as meat, seafood, and dairy. Generally, low-fat dairy and lean meats have less cholesterol than full-fat dairy and fatty meats. ?This information is not intended to replace advice given to you by your health care provider. Make sure you discuss any questions you have with your health care provider. ?Document Revised: 03/10/2021 Document Reviewed: 03/10/2021 ?Elsevier Patient Education ? 2023 Else[employer] Inc[employer] ? ?

## 2022-03-21 LAB — LIPID PANEL
Cholesterol: 192 mg/dL (ref 0–200)
HDL: 63.4 mg/dL (ref >39.00)
LDL Cholesterol: 117 mg/dL — ABNORMAL HIGH (ref 0–99)
NonHDL: 128.63
Total CHOL/HDL Ratio: 3
Triglycerides: 58 mg/dL (ref 0.0–149.0)
VLDL: 11.6 mg/dL (ref 0.0–40.0)

## 2022-03-21 LAB — COMPREHENSIVE METABOLIC PANEL
ALT: 34 U/L (ref 0–35)
AST: 25 U/L (ref 0–37)
Albumin: 4.5 g/dL (ref 3.5–5.2)
Alkaline Phosphatase: 73 U/L (ref 39–117)
BUN: 14 mg/dL (ref 6–23)
CO2: 26 mEq/L (ref 19–32)
Calcium: 9.5 mg/dL (ref 8.4–10.5)
Chloride: 107 mEq/L (ref 96–112)
Creatinine, Ser: 0.86 mg/dL (ref 0.40–1.20)
GFR: 74.37 mL/min (ref >60.00)
Glucose, Bld: 93 mg/dL (ref 70–99)
Potassium: 4 mEq/L (ref 3.5–5.1)
Sodium: 142 mEq/L (ref 135–145)
Total Bilirubin: 0.5 mg/dL (ref 0.2–1.2)
Total Protein: 7.4 g/dL (ref 6.0–8.3)

## 2022-05-22 ENCOUNTER — Institutional Professional Consult (permissible substitution): Payer: No Typology Code available for payment source | Admitting: Neurology

## 2022-06-07 ENCOUNTER — Other Ambulatory Visit: Payer: Self-pay | Admitting: Family Medicine

## 2022-06-07 DIAGNOSIS — Z0000 Encounter for general adult medical examination without abnormal findings: Secondary | ICD-10-CM

## 2022-06-07 DIAGNOSIS — I10 Essential (primary) hypertension: Secondary | ICD-10-CM

## 2022-07-28 ENCOUNTER — Other Ambulatory Visit: Payer: Self-pay | Admitting: Family Medicine

## 2022-07-28 DIAGNOSIS — I10 Essential (primary) hypertension: Secondary | ICD-10-CM

## 2022-07-28 DIAGNOSIS — Z0000 Encounter for general adult medical examination without abnormal findings: Secondary | ICD-10-CM

## 2022-08-16 ENCOUNTER — Ambulatory Visit: Payer: No Typology Code available for payment source | Admitting: Nurse Practitioner

## 2022-09-06 IMAGING — MG MM DIGITAL SCREENING BILAT W/ TOMO AND CAD
6 of 10 series · 6 of 30 positions shown · non-contrast
Comparison: Previous exam(s).

CLINICAL DATA: Screening.

EXAM:
DIGITAL SCREENING BILATERAL MAMMOGRAM WITH TOMOSYNTHESIS AND CAD
TECHNIQUE: Bilateral screening digital craniocaudal and mediolateral oblique
mammograms were obtained. Bilateral screening digital breast
tomosynthesis was performed. The images were evaluated with
computer-aided detection.

[R CC synth-2D]
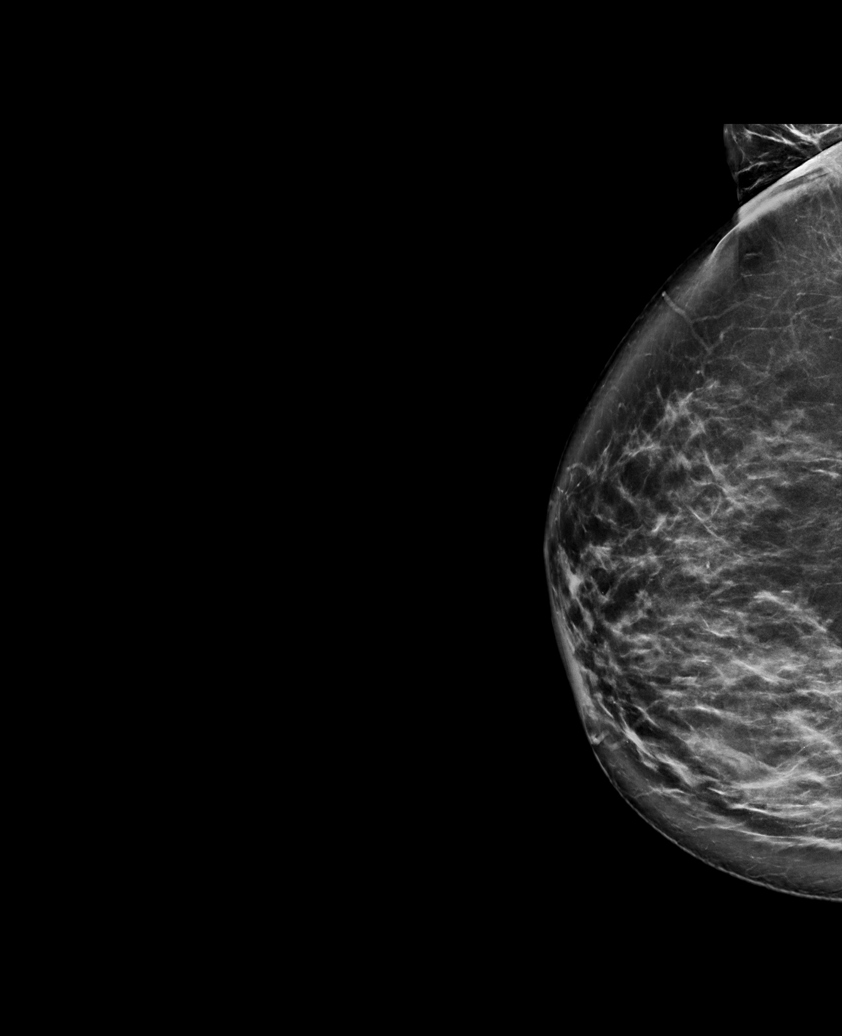

[L CC synth-2D]
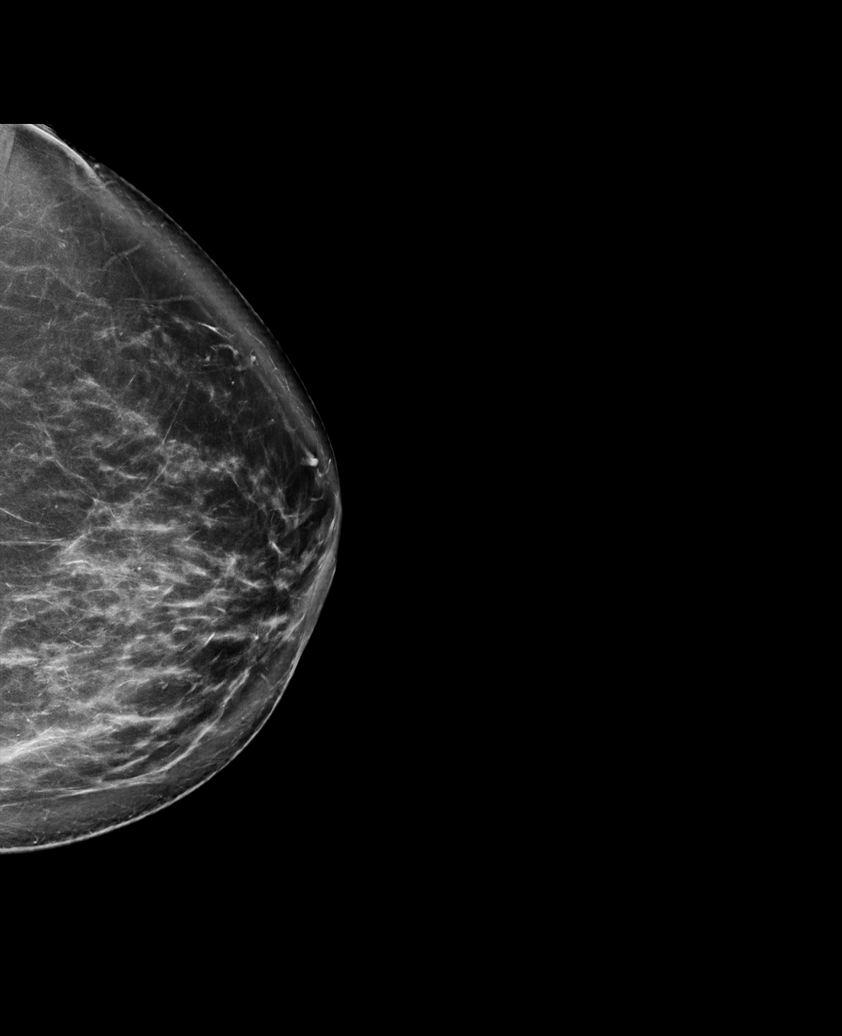

[L MLO synth-2D (1 of 2)]
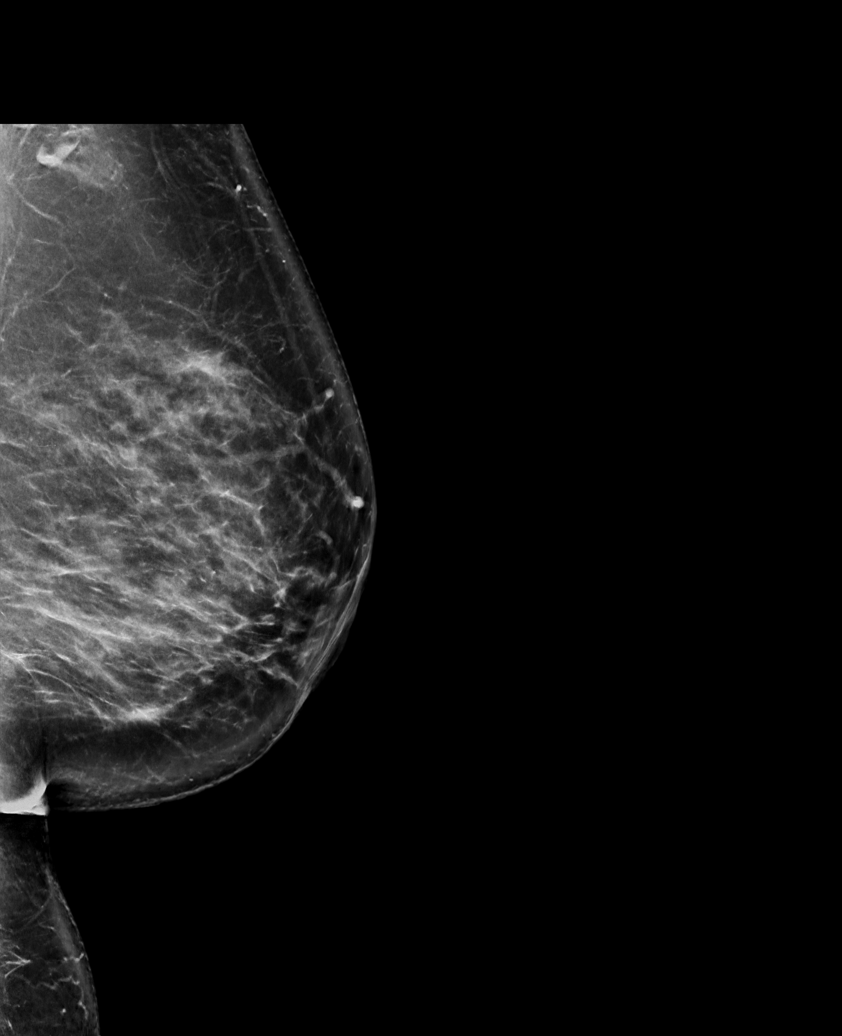

[L MLO synth-2D (2 of 2)]
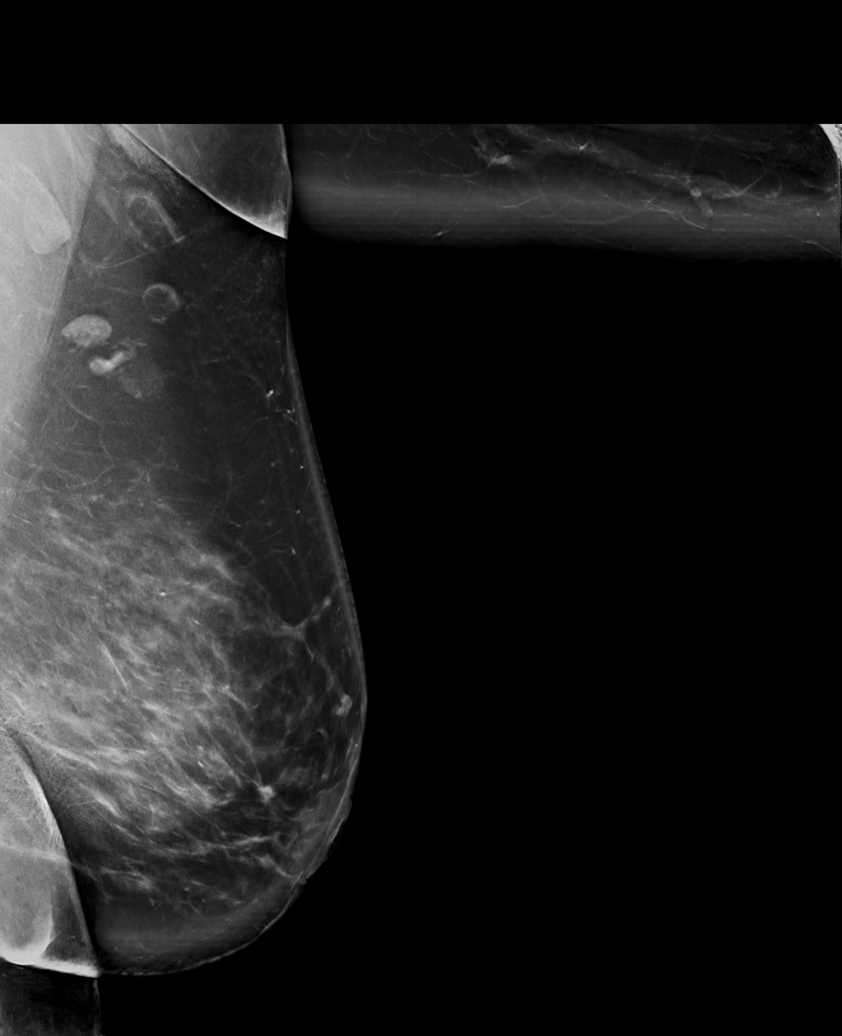

[R MLO synth-2D]
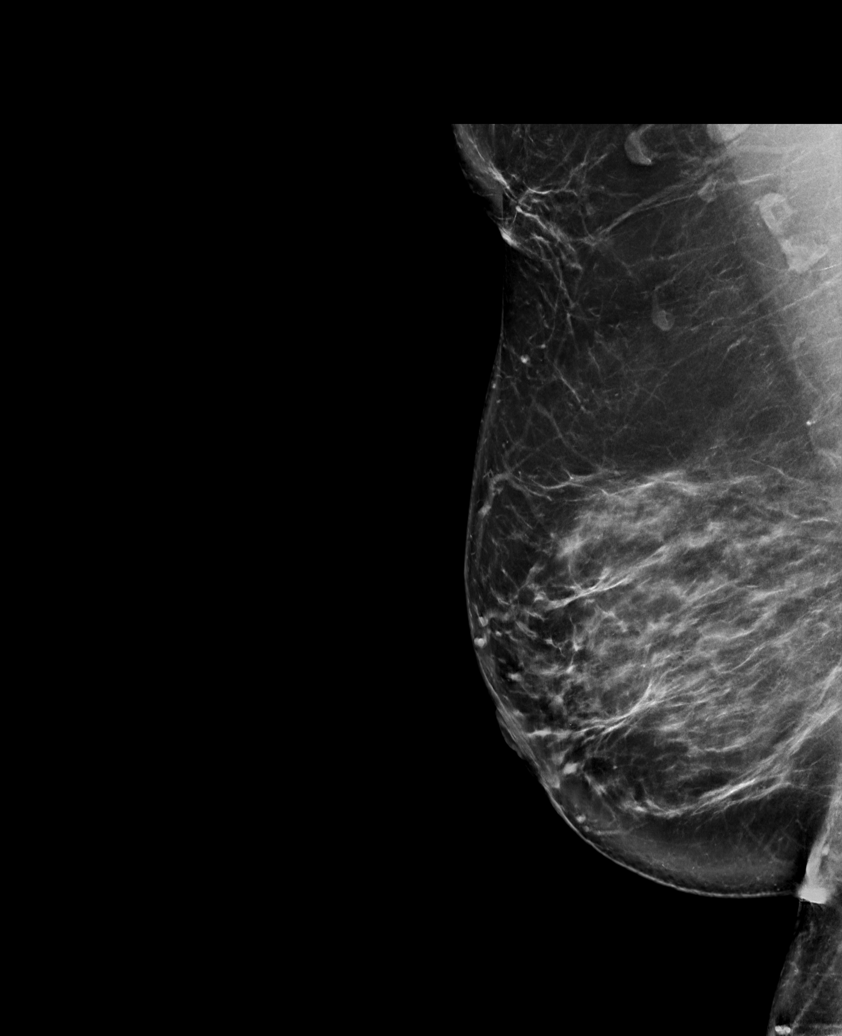

[L CC tomo · tomo slice 49/97.0]
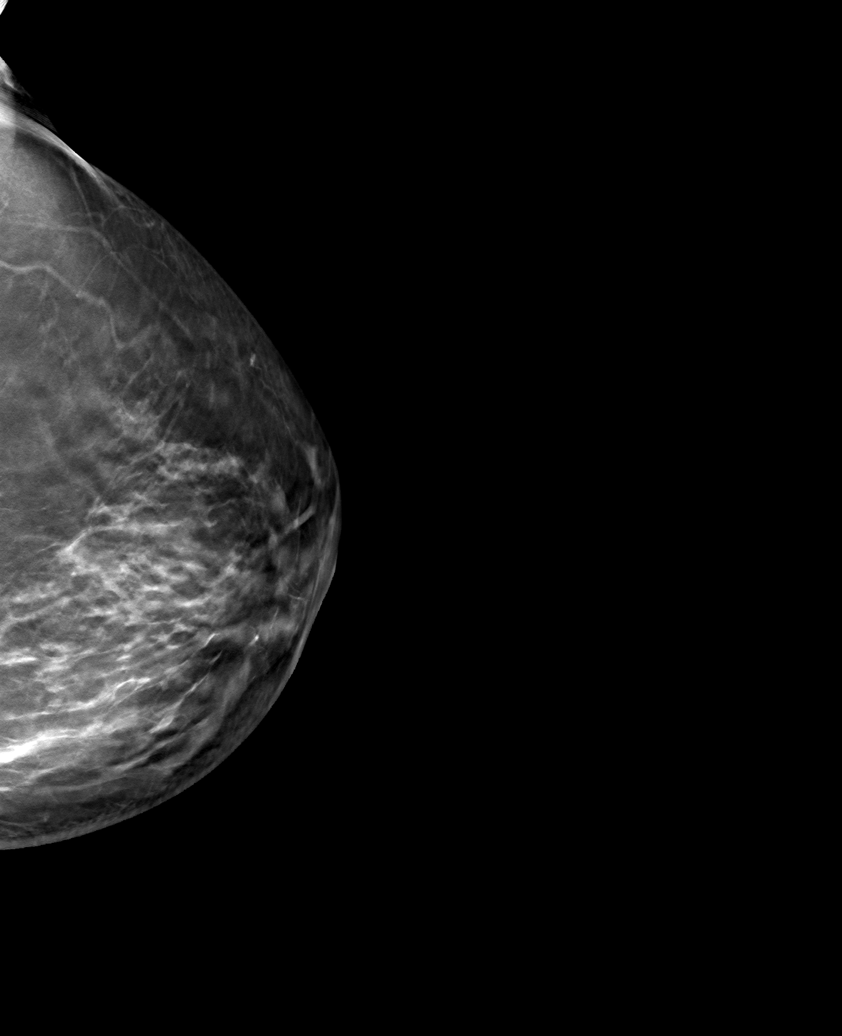

[6 of 30 positions shown; findings below may reference images not displayed]

ACR Breast Density Category c: The breast tissue is heterogeneously
dense, which may obscure small masses.
FINDINGS: There are no findings suspicious for malignancy.
IMPRESSION: No mammographic evidence of malignancy. A result letter of this
screening mammogram will be mailed directly to the patient.

RECOMMENDATION:
Screening mammogram in one year. (Code:Q3-W-BC3)

BI-RADS CATEGORY  1: Negative.

## 2022-09-20 ENCOUNTER — Ambulatory Visit (INDEPENDENT_AMBULATORY_CARE_PROVIDER_SITE_OTHER): Payer: No Typology Code available for payment source | Admitting: Nurse Practitioner

## 2022-09-20 ENCOUNTER — Encounter: Payer: Self-pay | Admitting: Nurse Practitioner

## 2022-09-20 ENCOUNTER — Ambulatory Visit (INDEPENDENT_AMBULATORY_CARE_PROVIDER_SITE_OTHER): Payer: No Typology Code available for payment source | Admitting: Family Medicine

## 2022-09-20 ENCOUNTER — Encounter: Payer: Self-pay | Admitting: Family Medicine

## 2022-09-20 VITALS — BP 132/82 | HR 70 | Resp 12 | Ht 61.5 in | Wt 211.0 lb

## 2022-09-20 VITALS — BP 118/78 | HR 65 | Temp 98.7°F | Resp 18 | Ht 61.0 in | Wt 212.0 lb

## 2022-09-20 DIAGNOSIS — Z0000 Encounter for general adult medical examination without abnormal findings: Secondary | ICD-10-CM

## 2022-09-20 DIAGNOSIS — E785 Hyperlipidemia, unspecified: Secondary | ICD-10-CM

## 2022-09-20 DIAGNOSIS — Z780 Asymptomatic menopausal state: Secondary | ICD-10-CM | POA: Diagnosis not present

## 2022-09-20 DIAGNOSIS — G4730 Sleep apnea, unspecified: Secondary | ICD-10-CM | POA: Insufficient documentation

## 2022-09-20 DIAGNOSIS — I10 Essential (primary) hypertension: Secondary | ICD-10-CM

## 2022-09-20 DIAGNOSIS — R10816 Epigastric abdominal tenderness: Secondary | ICD-10-CM

## 2022-09-20 DIAGNOSIS — Z01419 Encounter for gynecological examination (general) (routine) without abnormal findings: Secondary | ICD-10-CM | POA: Diagnosis not present

## 2022-09-20 MED ORDER — METOPROLOL SUCCINATE ER 200 MG PO TB24: 200.0000 mg | ORAL_TABLET | Freq: Every day | ORAL | 1 refills | Status: DC

## 2022-09-20 MED ORDER — AMLODIPINE BESYLATE 10 MG PO TABS: 10.0000 mg | ORAL_TABLET | Freq: Every day | ORAL | 1 refills | Status: DC

## 2022-09-20 MED ORDER — ROSUVASTATIN CALCIUM 10 MG PO TABS: 10.0000 mg | ORAL_TABLET | Freq: Every evening | ORAL | 3 refills | Status: DC

## 2022-09-20 NOTE — Progress Notes (Signed)
Subjective:   By signing my name below, I, Shehryar Baig, attest that this documentation has been prepared under the direction and in the presence of Ann Held, DO. 09/20/2022    Patient ID: Jodi Rodriguez, female    DOB: Dec 05, 1960, 61 y.o.   MRN: [id number]  Chief Complaint  Patient presents with   Annual Exam    Pt states fasting     HPI Patient is in today for a comprehensive physical exam.   She continues following up with her GYN specialist.  She is following up with a sleep specialist and was diagnosed with sleep apnea. She is wearing a CPAP machine but is not comfortable with it yet.  She denies having any fever, new muscle pain, new joint pain, new moles, congestion, sinus pain, sore throat, chest pain, palpations, cough, SOB, wheezing, n/v/d, constipation, blood in stool, dysuria, frequency, hematuria, or headaches at this time. She has no changes to her family medical history. She has no new surgical procedures to report.  She is maintaining a healthy diet. She is exercising regularly.  She has 2 pfizer Covid-19 vaccines. She is eligible for the shingrix vaccines but is not interested in receiving them at this time.    Past Medical History:  Diagnosis Date   Anxiety    Fibroid    15 WK SIZE UT   Hyperlipidemia    Hypertension    Sleep apnea    [place]- CPAP    Past Surgical History:  Procedure Laterality Date   BREAST BIOPSY  1981   BREAST BIOPSY    Family History  Problem Relation Age of Onset   Thyroid disease Sister    Hypertension Sister    Diabetes Father    Hypertension Father    Hypertension Mother    Alzheimer's disease Mother    Alzheimer's disease Other        and neurological disorder    Social History   Socioeconomic History   Marital status: Married    Spouse name: Not on file   Number of children: Not on file   Years of education: Not on file   Highest education level: Not on file  Occupational History    Occupation: Insurance  Tobacco Use   Smoking status: Never   Smokeless tobacco: Never  Vaping Use   Vaping Use: Never used  Substance and Sexual Activity   Alcohol use: Yes    Comment: occas   Drug use: No   Sexual activity: Yes    Partners: Male    Birth control/protection: Other-see comments    Comment: vasectomy- fewer than 5 partners first sexual  active at age 48  Other Topics Concern   Not on file  Social History Narrative   Exercise-- 2-4x a week -- walking    Social Determinants of Health   Financial Resource Strain: Not on file  Food Insecurity: Not on file  Transportation Needs: Not on file  Physical Activity: Not on file  Stress: Not on file  Social Connections: Not on file  Intimate Partner Violence: Not on file    Outpatient Medications Prior to Visit  Medication Sig Dispense Refill   Ascorbic Acid (VITAMIN C PO) Take by mouth daily.       famotidine (PEPCID) 20 MG tablet TAKE 1 TABLET BY MOUTH TWICE A DAY 180 tablet 1   Multiple Vitamin (MULTIVITAMIN) tablet Take 1 tablet by mouth daily.       amLODipine (NORVASC) 10  MG tablet TAKE 1 TABLET BY MOUTH EVERY DAY 90 tablet 1   metoprolol (TOPROL-XL) 200 MG 24 hr tablet TAKE 1 TABLET BY MOUTH EVERY DAY 90 tablet 1   rosuvastatin (CRESTOR) 10 MG tablet Take 1 tablet (10 mg total) by mouth at bedtime. 30 tablet 3   No facility-administered medications prior to visit.    No Known Allergies  Review of Systems  Constitutional:  Negative for fever.  HENT:  Negative for congestion, sinus pain and sore throat.   Respiratory:  Negative for cough, shortness of breath and wheezing.   Cardiovascular:  Negative for chest pain and palpitations.  Gastrointestinal:  Negative for abdominal pain, constipation, diarrhea, nausea and vomiting.  Genitourinary:  Negative for dysuria, frequency and hematuria.  Musculoskeletal:        (-)new muscle pain (-)new joint pain  Skin:        (-)New moles  Neurological:  Negative  for headaches.       Objective:    Physical Exam Constitutional:      General: She is not in acute distress.    Appearance: Normal appearance. She is not ill-appearing.  HENT:     Head: Normocephalic and atraumatic.     Right Ear: Tympanic membrane, ear canal and external ear normal.     Left Ear: Tympanic membrane, ear canal and external ear normal.  Eyes:     Extraocular Movements: Extraocular movements intact.     Pupils: Pupils are equal, round, and reactive to light.  Cardiovascular:     Rate and Rhythm: Normal rate and regular rhythm.     Heart sounds: Normal heart sounds. No murmur heard.    No gallop.  Pulmonary:     Effort: Pulmonary effort is normal. No respiratory distress.     Breath sounds: Normal breath sounds. No wheezing or rales.  Abdominal:     General: Bowel sounds are normal. There is no distension.     Palpations: Abdomen is soft.     Tenderness: There is no abdominal tenderness. There is no guarding.  Skin:    General: Skin is warm and dry.  Neurological:     Mental Status: She is alert and oriented to person, place, and time.  Psychiatric:        Judgment: Judgment normal.     BP 118/78 (BP Location: Left Arm, Patient Position: Sitting, Cuff Size: Large)   Pulse 65   Temp 98.7 F (37.1 C) (Oral)   Resp 18   Ht '5\' 1"'$  (1.549 m)   Wt 212 lb (96.2 kg)   LMP 02/26/2014   SpO2 95%   BMI 40.06 kg/m  Wt Readings from Last 3 Encounters:  09/20/22 211 lb (95.7 kg)  09/20/22 212 lb (96.2 kg)  03/20/22 208 lb 6.4 oz (94.5 kg)    Diabetic Foot Exam - Simple   No data filed    Lab Results  Component Value Date   WBC 6.1 09/20/2022   HGB 13.1 09/20/2022   HCT 39.8 09/20/2022   PLT 281.0 09/20/2022   GLUCOSE 92 09/20/2022   CHOL 272 (H) 09/20/2022   TRIG 72.0 09/20/2022   HDL 68.80 09/20/2022   LDLDIRECT 151.1 07/28/2013   LDLCALC 189 (H) 09/20/2022   ALT 25 09/20/2022   AST 23 09/20/2022   NA 140 09/20/2022   K 4.0 09/20/2022   CL 105  09/20/2022   CREATININE 0.88 09/20/2022   BUN 12 09/20/2022   CO2 28 09/20/2022   TSH 2.37  09/20/2022   MICROALBUR 1.8 09/19/2021    Lab Results  Component Value Date   TSH 2.37 09/20/2022   Lab Results  Component Value Date   WBC 6.1 09/20/2022   HGB 13.1 09/20/2022   HCT 39.8 09/20/2022   MCV 85.4 09/20/2022   PLT 281.0 09/20/2022   Lab Results  Component Value Date   NA 140 09/20/2022   K 4.0 09/20/2022   CO2 28 09/20/2022   GLUCOSE 92 09/20/2022   BUN 12 09/20/2022   CREATININE 0.88 09/20/2022   BILITOT 0.4 09/20/2022   ALKPHOS 67 09/20/2022   AST 23 09/20/2022   ALT 25 09/20/2022   PROT 7.5 09/20/2022   ALBUMIN 4.5 09/20/2022   CALCIUM 9.7 09/20/2022   GFR 72.09 09/20/2022   Lab Results  Component Value Date   CHOL 272 (H) 09/20/2022   Lab Results  Component Value Date   HDL 68.80 09/20/2022   Lab Results  Component Value Date   LDLCALC 189 (H) 09/20/2022   Lab Results  Component Value Date   TRIG 72.0 09/20/2022   Lab Results  Component Value Date   CHOLHDL 4 09/20/2022   No results found for: "HGBA1C"  Mammogram- Last completed 11/15/2021.  Pap smear- Last completed 08/04/2019.  Colonoscopy- Last completed 05/08/2017. Results are normal. Repeat in 10 years.      Assessment & Plan:   Problem List Items Addressed This Visit       Unprioritized   Sleep apnea   Preventative health care - Primary    Ghm utd Check labs  See avs      Relevant Medications   amLODipine (NORVASC) 10 MG tablet   metoprolol (TOPROL-XL) 200 MG 24 hr tablet   Other Relevant Orders   CBC with Differential/Platelet (Completed)   Comprehensive metabolic panel (Completed)   Lipid panel (Completed)   TSH (Completed)   Hyperlipidemia    Encourage heart healthy diet such as MIND or DASH diet, increase exercise, avoid trans fats, simple carbohydrates and processed foods, consider a krill or fish or flaxseed oil cap daily.        Relevant Medications    rosuvastatin (CRESTOR) 10 MG tablet   amLODipine (NORVASC) 10 MG tablet   metoprolol (TOPROL-XL) 200 MG 24 hr tablet   Other Relevant Orders   CBC with Differential/Platelet (Completed)   Comprehensive metabolic panel (Completed)   Lipid panel (Completed)   TSH (Completed)   Essential hypertension    Well controlled, no changes to meds. Encouraged heart healthy diet such as the DASH diet and exercise as tolerated.        Relevant Medications   rosuvastatin (CRESTOR) 10 MG tablet   amLODipine (NORVASC) 10 MG tablet   metoprolol (TOPROL-XL) 200 MG 24 hr tablet   Other Relevant Orders   CBC with Differential/Platelet (Completed)   Comprehensive metabolic panel (Completed)   Lipid panel (Completed)   TSH (Completed)   Epigastric abdominal tenderness without rebound tenderness     Meds ordered this encounter  Medications   rosuvastatin (CRESTOR) 10 MG tablet    Sig: Take 1 tablet (10 mg total) by mouth at bedtime.    Dispense:  90 tablet    Refill:  3   amLODipine (NORVASC) 10 MG tablet    Sig: Take 1 tablet (10 mg total) by mouth daily.    Dispense:  90 tablet    Refill:  1   metoprolol (TOPROL-XL) 200 MG 24 hr tablet    Sig: Take 1  tablet (200 mg total) by mouth daily.    Dispense:  90 tablet    Refill:  1    I, Ann Held, DO, personally preformed the services described in this documentation.  All medical record entries made by the scribe were at my direction and in my presence.  I have reviewed the chart and discharge instructions (if applicable) and agree that the record reflects my personal performance and is accurate and complete. 09/20/2022   I,Shehryar Baig,acting as a scribe for Ann Held, DO.,have documented all relevant documentation on the behalf of Ann Held, DO,as directed by  Ann Held, DO while in the presence of Ann Held, DO.   Ann Held, DO

## 2022-09-20 NOTE — Progress Notes (Signed)
   Jodi Rodriguez 19-Mar-1961 [id number]   History:  61 y.o. G2P2 presents for annual exam. No GYN complaints. Postmenopausal - no HRT, no bleeding. Occasional hot flashes. Normal pap and mammogram history. HTN, HLD managed by PCP.   Gynecologic History Patient's last menstrual period was 02/26/2014.   Contraception: post menopausal status Sexually active: Yes  Health maintenance Last Pap: 08/04/2019. Results were: Normal neg HPV Last mammogram: 11/15/2021. Results were: Normal Last colonoscopy: 05/08/2017. Results were: Normal, 10-year recall Last Dexa: Never  Past medical history, past surgical history, family history and social history were all reviewed and documented in the EPIC chart. Married. Works remote for [employer]. 10 yo son, lives in [place]. 10 to daughter in [place].   ROS:  A ROS was performed and pertinent positives and negatives are included.  Exam:  Vitals:   09/20/22 1547  BP: 132/82  Pulse: 70  Resp: 12  Weight: 211 lb (95.7 kg)  Height: 5' 1.5" (1.562 m)     Body mass index is 39.22 kg/m.  General appearance:  Normal Thyroid:  Symmetrical, normal in size, without palpable masses or nodularity. Respiratory  Auscultation:  Clear without wheezing or rhonchi Cardiovascular  Auscultation:  Regular rate, without rubs, murmurs or gallops  Edema/varicosities:  Not grossly evident Abdominal  Soft,nontender, without masses, guarding or rebound.  Liver/spleen:  No organomegaly noted  Hernia:  None appreciated  Skin  Inspection:  Grossly normal   Breasts: Examined lying and sitting.   Right: Without masses, retractions, discharge or axillary adenopathy.   Left: Without masses, retractions, discharge or axillary adenopathy. Genitourinary   Inguinal/mons:  Normal without inguinal adenopathy  External genitalia:  Normal appearing vulva with no masses, tenderness, or lesions  BUS/Urethra/Skene's glands:  Normal  Vagina:  Normal appearing with normal  color and discharge, no lesions  Cervix:  Normal appearing without discharge or lesions  Uterus:  Difficult to palpate due to body habitus but no gross masses or tenderness  Adnexa/parametria:     Rt: Normal in size, without masses or tenderness.   Lt: Normal in size, without masses or tenderness.  Anus and perineum: Normal  Digital rectal exam: Normal sphincter tone without palpated masses or tenderness  Patient informed chaperone available to be present for breast and pelvic exam. Patient has requested no chaperone to be present. Patient has been advised what will be completed during breast and pelvic exam.   Assessment/Plan:  61 y.o. G2P2 for annual exam.   Well female exam with routine gynecological exam - Education provided on SBEs, importance of preventative screenings, current guidelines, high calcium diet, regular exercise, and multivitamin daily.  Labs with PCP today.   Postmenopausal - no HRT, no bleeding. Mild hot flashes.   Screening for cervical cancer - Normal Pap history.  Will repeat at 5-year interval per guidelines.  Screening for breast cancer - Normal mammogram history.  Continue annual screenings. Normal breast exam today.  Screening for colon cancer - 2018 colonoscopy. Will repeat at GI's recommended interval.   Screening for osteoporosis - Average risk. Will plan Dexa at age 66.   Follow up in 1 year for annual.      [name] [facility], 4:03 PM 09/20/2022

## 2022-09-21 LAB — COMPREHENSIVE METABOLIC PANEL
ALT: 25 U/L (ref 0–35)
AST: 23 U/L (ref 0–37)
Albumin: 4.5 g/dL (ref 3.5–5.2)
Alkaline Phosphatase: 67 U/L (ref 39–117)
BUN: 12 mg/dL (ref 6–23)
CO2: 28 mEq/L (ref 19–32)
Calcium: 9.7 mg/dL (ref 8.4–10.5)
Chloride: 105 mEq/L (ref 96–112)
Creatinine, Ser: 0.88 mg/dL (ref 0.40–1.20)
GFR: 72.09 mL/min (ref >60.00)
Glucose, Bld: 92 mg/dL (ref 70–99)
Potassium: 4 mEq/L (ref 3.5–5.1)
Sodium: 140 mEq/L (ref 135–145)
Total Bilirubin: 0.4 mg/dL (ref 0.2–1.2)
Total Protein: 7.5 g/dL (ref 6.0–8.3)

## 2022-09-21 LAB — CBC WITH DIFFERENTIAL/PLATELET
Basophils Absolute: 0.1 10*3/uL (ref 0.0–0.1)
Basophils Relative: 1.3 % (ref 0.0–3.0)
Eosinophils Absolute: 0.1 10*3/uL (ref 0.0–0.7)
Eosinophils Relative: 1.6 % (ref 0.0–5.0)
HCT: 39.8 % (ref 36.0–46.0)
Hemoglobin: 13.1 g/dL (ref 12.0–15.0)
Lymphocytes Relative: 51.7 % — ABNORMAL HIGH (ref 12.0–46.0)
Lymphs Abs: 3.2 10*3/uL (ref 0.7–4.0)
MCHC: 33 g/dL (ref 30.0–36.0)
MCV: 85.4 fl (ref 78.0–100.0)
Monocytes Absolute: 0.3 10*3/uL (ref 0.1–1.0)
Monocytes Relative: 5.5 % (ref 3.0–12.0)
Neutro Abs: 2.4 10*3/uL (ref 1.4–7.7)
Neutrophils Relative %: 39.9 % — ABNORMAL LOW (ref 43.0–77.0)
Platelets: 281 10*3/uL (ref 150.0–400.0)
RBC: 4.66 Mil/uL (ref 3.87–5.11)
RDW: 14.5 % (ref 11.5–15.5)
WBC: 6.1 10*3/uL (ref 4.0–10.5)

## 2022-09-21 LAB — LIPID PANEL
Cholesterol: 272 mg/dL — ABNORMAL HIGH (ref 0–200)
HDL: 68.8 mg/dL (ref >39.00)
LDL Cholesterol: 189 mg/dL — ABNORMAL HIGH (ref 0–99)
NonHDL: 203.23
Total CHOL/HDL Ratio: 4
Triglycerides: 72 mg/dL (ref 0.0–149.0)
VLDL: 14.4 mg/dL (ref 0.0–40.0)

## 2022-09-21 LAB — TSH: TSH: 2.37 u[IU]/mL (ref 0.35–5.50)

## 2022-09-25 NOTE — Assessment & Plan Note (Signed)
Ghm utd Check labs  See avs 

## 2022-09-25 NOTE — Assessment & Plan Note (Signed)
Well controlled, no changes to meds. Encouraged heart healthy diet such as the DASH diet and exercise as tolerated.  °

## 2022-09-25 NOTE — Assessment & Plan Note (Signed)
Encourage heart healthy diet such as MIND or DASH diet, increase exercise, avoid trans fats, simple carbohydrates and processed foods, consider a krill or fish or flaxseed oil cap daily.  °

## 2023-02-15 ENCOUNTER — Telehealth: Payer: Self-pay | Admitting: Family Medicine

## 2023-02-15 NOTE — Telephone Encounter (Signed)
Please schedule an appt w/ Dr. Laury Axon.

## 2023-02-15 NOTE — Telephone Encounter (Signed)
Patient called requesting a referral to a pulmonologist Dr. Kathrynn Running. Patient stated she's had a cough she cannot get rid of and has not spoken to Dr. Laury Axon about it before. Please advise.

## 2023-02-19 ENCOUNTER — Ambulatory Visit: Payer: No Typology Code available for payment source | Admitting: Pulmonary Disease

## 2023-02-19 ENCOUNTER — Encounter: Payer: Self-pay | Admitting: Pulmonary Disease

## 2023-02-19 ENCOUNTER — Ambulatory Visit: Payer: No Typology Code available for payment source | Admitting: Family Medicine

## 2023-02-19 VITALS — BP 140/78 | HR 73 | Temp 98.4°F | Ht 61.0 in | Wt 223.0 lb

## 2023-02-19 DIAGNOSIS — G4730 Sleep apnea, unspecified: Secondary | ICD-10-CM

## 2023-02-19 DIAGNOSIS — R052 Subacute cough: Secondary | ICD-10-CM | POA: Diagnosis not present

## 2023-02-19 NOTE — Progress Notes (Signed)
Jodi Rodriguez    [id number]    1961-06-19  Primary Care Physician:Lowne [name] Grayling Congress, DO  Referring Physician: Zola Button, Grayling Congress, DO [address] [place],  [place] [address]  Chief complaint: Follow-up for post-COVID  HPI: 62 y.o. who  has a past medical history of Anxiety, Fibroid, Hyperlipidemia, Hypertension, and Sleep apnea.   She developed a mild case of COVID in February 2020.  Treated with Paxlovid.  Since then she has had cough with clear mucus production, occasional wheezing and dyspnea on exertion.  Since she made this appointment she has been improving with mild residual symptoms.  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  No feather pillows or comforters Smoking history: Never smoker Travel history: Originally from [place].  No significant recent travel Relevant family history: No family history of lung disease  Outpatient Encounter Medications as of 02/19/2023  Medication Sig   amLODipine (NORVASC) 10 MG tablet Take 1 tablet (10 mg total) by mouth daily.   Ascorbic Acid (VITAMIN C PO) Take by mouth daily.     famotidine (PEPCID) 20 MG tablet TAKE 1 TABLET BY MOUTH TWICE A DAY   metoprolol (TOPROL-XL) 200 MG 24 hr tablet Take 1 tablet (200 mg total) by mouth daily.   Multiple Vitamin (MULTIVITAMIN) tablet Take 1 tablet by mouth daily.     rosuvastatin (CRESTOR) 10 MG tablet Take 1 tablet (10 mg total) by mouth at bedtime.   No facility-administered encounter medications on file as of 02/19/2023.    Allergies as of 02/19/2023   (No Known Allergies)    Past Medical History:  Diagnosis Date   Anxiety    Fibroid    16 WK SIZE UT   Hyperlipidemia    Hypertension    Sleep apnea    [facility]- CPAP    Past Surgical History:  Procedure Laterality Date   BREAST BIOPSY  1981   BREAST BIOPSY    Family History  Problem  Relation Age of Onset   Thyroid disease Sister    Hypertension Sister    Diabetes Father    Hypertension Father    Hypertension Mother    Alzheimer's disease Mother    Alzheimer's disease Other        and neurological disorder    Social History   Socioeconomic History   Marital status: Married    Spouse name: Not on file   Number of children: Not on file   Years of education: Not on file   Highest education level: Not on file  Occupational History   Occupation: Insurance  Tobacco Use   Smoking status: Never   Smokeless tobacco: Never  Vaping Use   Vaping Use: Never used  Substance and Sexual Activity   Alcohol use: Yes    Comment: occas   Drug use: No   Sexual activity: Yes    Partners: Male    Birth control/protection: Other-see comments    Comment: vasectomy- fewer than 5 partners first sexual  active at age 15  Other Topics Concern   Not on file  Social History Narrative   Exercise-- 2-4x a week -- walking    Social Determinants of Health   Financial Resource Strain: Not on file  Food Insecurity: Not on file  Transportation Needs: Not on file  Physical Activity: Not on  file  Stress: Not on file  Social Connections: Not on file  Intimate Partner Violence: Not on file    Review of systems: Review of Systems  Constitutional: Negative for fever and chills.  HENT: Negative.   Eyes: Negative for blurred vision.  Respiratory: as per HPI  Cardiovascular: Negative for chest pain and palpitations.  Gastrointestinal: Negative for vomiting, diarrhea, blood per rectum. Genitourinary: Negative for dysuria, urgency, frequency and hematuria.  Musculoskeletal: Negative for myalgias, back pain and joint pain.  Skin: Negative for itching and rash.  Neurological: Negative for dizziness, tremors, focal weakness, seizures and loss of consciousness.  Endo/Heme/Allergies: Negative for environmental allergies.  Psychiatric/Behavioral: Negative for depression, suicidal ideas  and hallucinations.  All other systems reviewed and are negative.  Physical Exam: Blood pressure (!) 140/78, pulse 73, temperature 98.4 F (36.9 C), temperature source Oral, height 5\' 1"  (1.549 m), weight 223 lb (101.2 kg), last menstrual period 02/26/2014, SpO2 95 %. Gen:      No acute distress HEENT:  EOMI, sclera anicteric Neck:     No masses; no thyromegaly Lungs:    Clear to auscultation bilaterally; normal respiratory effort CV:         Regular rate and rhythm; no murmurs Abd:      + bowel sounds; soft, non-tender; no palpable masses, no distension Ext:    No edema; adequate peripheral perfusion Skin:      Warm and dry; no rash Neuro: alert and oriented x 3 Psych: normal mood and affect  Data Reviewed: Imaging:  PFTs:  Labs:  Assessment:  Post COVID-19 She has some residual cough after her infection but overall improving and she seems to be heading in the right direction. Do not suspect underlying interstitial lung disease as her initial infection was mild We discussed getting an x-ray today but decided to hold off as she is feeling better.  She can return to clinic as needed  Sleep apnea Stable on CPAP.  Follow[facility]  Plan/Recommendations: Follow-up as needed  Chilton Greathouse MD [facility] 02/19/2023, 3:00 PM  CC: Donato Schultz, *

## 2023-02-19 NOTE — Patient Instructions (Signed)
I am glad you are proving with regard to your breathing Will continue to monitor this as you seem to be headed in the right direction Return to clinic as needed

## 2023-03-29 ENCOUNTER — Encounter: Payer: Self-pay | Admitting: Family Medicine

## 2023-03-29 ENCOUNTER — Ambulatory Visit: Payer: No Typology Code available for payment source | Admitting: Family Medicine

## 2023-03-29 VITALS — BP 112/80 | HR 59 | Temp 98.0°F | Resp 18 | Ht 61.0 in | Wt 222.4 lb

## 2023-03-29 DIAGNOSIS — R591 Generalized enlarged lymph nodes: Secondary | ICD-10-CM

## 2023-03-29 DIAGNOSIS — R82998 Other abnormal findings in urine: Secondary | ICD-10-CM

## 2023-03-29 DIAGNOSIS — R5383 Other fatigue: Secondary | ICD-10-CM | POA: Diagnosis not present

## 2023-03-29 LAB — COMPREHENSIVE METABOLIC PANEL
ALT: 26 U/L (ref 0–35)
AST: 26 U/L (ref 0–37)
Albumin: 4.3 g/dL (ref 3.5–5.2)
Alkaline Phosphatase: 63 U/L (ref 39–117)
BUN: 15 mg/dL (ref 6–23)
CO2: 26 mEq/L (ref 19–32)
Calcium: 9.6 mg/dL (ref 8.4–10.5)
Chloride: 106 mEq/L (ref 96–112)
Creatinine, Ser: 0.8 mg/dL (ref 0.40–1.20)
GFR: 80.53 mL/min (ref >60.00)
Glucose, Bld: 103 mg/dL — ABNORMAL HIGH (ref 70–99)
Potassium: 4.1 mEq/L (ref 3.5–5.1)
Sodium: 141 mEq/L (ref 135–145)
Total Bilirubin: 0.5 mg/dL (ref 0.2–1.2)
Total Protein: 7.3 g/dL (ref 6.0–8.3)

## 2023-03-29 LAB — POC URINALSYSI DIPSTICK (AUTOMATED)
Bilirubin, UA: NEGATIVE
Blood, UA: NEGATIVE
Glucose, UA: NEGATIVE
Ketones, UA: NEGATIVE
Nitrite, UA: NEGATIVE
Protein, UA: NEGATIVE
Spec Grav, UA: 1.02 (ref 1.010–1.025)
Urobilinogen, UA: 0.2 E.U./dL
pH, UA: 6 (ref 5.0–8.0)

## 2023-03-29 LAB — TSH: TSH: 2.76 u[IU]/mL (ref 0.35–5.50)

## 2023-03-29 LAB — VITAMIN D 25 HYDROXY (VIT D DEFICIENCY, FRACTURES): VITD: 37.26 ng/mL (ref 30.00–100.00)

## 2023-03-29 LAB — MONONUCLEOSIS SCREEN: Mono Screen: NEGATIVE

## 2023-03-29 LAB — VITAMIN B12: Vitamin B-12: 668 pg/mL (ref 211–911)

## 2023-03-29 NOTE — Progress Notes (Signed)
Subjective:   By signing my name below, I, Jodi Rodriguez, attest that this documentation has been prepared under the direction and in the presence of Donato Schultz, DO 03/29/23   Patient ID: Jodi Rodriguez, female    DOB: [birth year], 62 y.o.   MRN: [id number]  Chief Complaint  Patient presents with   Lymph node swelling    Pt states having some lymph node swelling on the right side of her neck. Noticed it on Sunday. Had some pain but has gone away. Pt states some fatigue    HPI Patient is in today for an office visit.   She complains of a swollen lymph node on the right side of her neck since Sunday. She reports tenderness on Sunday and Monday. She states the swelling and pain has since improved. Denies any difficulty chewing  She also complains of fatigue after having Covid. She takes multivitamins and vitamin C. She reports occasional urgency, but no other urinary symptoms.   Past Medical History:  Diagnosis Date   Anxiety    Fibroid    16 WK SIZE UT   Hyperlipidemia    Hypertension    Sleep apnea    [facility]- CPAP    Past Surgical History:  Procedure Laterality Date   BREAST BIOPSY  1981   BREAST BIOPSY    Family History  Problem Relation Age of Onset   Thyroid disease Sister    Hypertension Sister    Diabetes Father    Hypertension Father    Hypertension Mother    Alzheimer's disease Mother    Alzheimer's disease Other        and neurological disorder    Social History   Socioeconomic History   Marital status: Married    Spouse name: Not on file   Number of children: Not on file   Years of education: Not on file   Highest education level: Not on file  Occupational History   Occupation: Insurance  Tobacco Use   Smoking status: Never   Smokeless tobacco: Never  Vaping Use   Vaping Use: Never used  Substance and Sexual Activity   Alcohol use: Yes    Comment: occas   Drug use: No   Sexual activity: Yes    Partners: Male     Birth control/protection: Other-see comments    Comment: vasectomy- fewer than 5 partners first sexual  active at age 27  Other Topics Concern   Not on file  Social History Narrative   Exercise-- 2-4x a week -- walking    Social Determinants of Health   Financial Resource Strain: Not on file  Food Insecurity: Not on file  Transportation Needs: Not on file  Physical Activity: Not on file  Stress: Not on file  Social Connections: Not on file  Intimate Partner Violence: Not on file    Outpatient Medications Prior to Visit  Medication Sig Dispense Refill   amLODipine (NORVASC) 10 MG tablet Take 1 tablet (10 mg total) by mouth daily. 90 tablet 1   Ascorbic Acid (VITAMIN C PO) Take by mouth daily.       famotidine (PEPCID) 20 MG tablet TAKE 1 TABLET BY MOUTH TWICE A DAY 180 tablet 1   metoprolol (TOPROL-XL) 200 MG 24 hr tablet Take 1 tablet (200 mg total) by mouth daily. 90 tablet 1   Multiple Vitamin (MULTIVITAMIN) tablet Take 1 tablet by mouth daily.       rosuvastatin (CRESTOR) 10 MG tablet Take  1 tablet (10 mg total) by mouth at bedtime. 90 tablet 3   No facility-administered medications prior to visit.    No Known Allergies  Review of Systems  Constitutional:  Positive for malaise/fatigue. Negative for fever.  HENT:  Negative for congestion.   Eyes:  Negative for blurred vision.  Respiratory:  Negative for shortness of breath.   Cardiovascular:  Negative for chest pain, palpitations and leg swelling.  Gastrointestinal:  Negative for abdominal pain, blood in stool and nausea.  Genitourinary:  Negative for dysuria and frequency.  Musculoskeletal:  Negative for falls.  Skin:  Negative for rash.  Neurological:  Negative for dizziness, loss of consciousness and headaches.  Endo/Heme/Allergies:  Negative for environmental allergies.  Psychiatric/Behavioral:  Negative for depression. The patient is not nervous/anxious.        Objective:    Physical Exam Vitals and nursing  note reviewed.  Constitutional:      Appearance: She is well-developed.  HENT:     Head: Normocephalic and atraumatic.  Eyes:     Conjunctiva/sclera: Conjunctivae normal.  Neck:     Thyroid: No thyromegaly.     Vascular: No carotid bruit or JVD.      Comments: Lymph node behind right ear --- slightly enlarged and tender  Cardiovascular:     Rate and Rhythm: Normal rate and regular rhythm.     Heart sounds: Normal heart sounds. No murmur heard. Pulmonary:     Effort: Pulmonary effort is normal. No respiratory distress.     Breath sounds: Normal breath sounds. No wheezing or rales.  Chest:     Chest wall: No tenderness.  Musculoskeletal:     Cervical back: Normal range of motion and neck supple.  Lymphadenopathy:     Cervical: Cervical adenopathy present.     Comments: Small lymph node behind right ear, non tender  Neurological:     General: No focal deficit present.     Mental Status: She is alert and oriented to person, place, and time.     BP 112/80 (BP Location: Left Arm, Patient Position: Sitting, Cuff Size: Large)   Pulse (!) 59   Temp 98 F (36.7 C) (Oral)   Resp 18   Ht 5\' 1"  (1.549 m)   Wt 222 lb 6.4 oz (100.9 kg)   LMP 02/26/2014   SpO2 95%   BMI 42.02 kg/m  Wt Readings from Last 3 Encounters:  03/29/23 222 lb 6.4 oz (100.9 kg)  02/19/23 223 lb (101.2 kg)  09/20/22 211 lb (95.7 kg)       Assessment & Plan:  Lymphadenopathy -     CBC+Platelet+Hem Review -     CMV abs, IgG+IgM (cytomegalovirus) -     Comprehensive metabolic panel -     Epstein-Barr virus VCA antibody panel -     Mononucleosis screen -     TSH -     Vitamin B12 -     VITAMIN D 25 Hydroxy (Vit-D Deficiency, Fractures) -     POCT Urinalysis Dipstick (Automated) -     Urine Culture  Other fatigue -     CBC+Platelet+Hem Review -     CMV abs, IgG+IgM (cytomegalovirus) -     Comprehensive metabolic panel -     Epstein-Barr virus VCA antibody panel -     Mononucleosis screen -      TSH -     Vitamin B12 -     VITAMIN D 25 Hydroxy (Vit-D Deficiency, Fractures) -  POCT Urinalysis Dipstick (Automated) -     Urine Culture  Leukocytes in urine -     Urine Culture     I,Rachel Rivera,acting as a scribe for Donato Schultz, DO.,have documented all relevant documentation on the behalf of Donato Schultz, DO,as directed by  Donato Schultz, DO while in the presence of Donato Schultz, DO.   I, Donato Schultz, DO, personally preformed the services described in this documentation.  All medical record entries made by the scribe were at my direction and in my presence.  I have reviewed the chart and discharge instructions (if applicable) and agree that the record reflects my personal performance and is accurate and complete. 03/29/23   Donato Schultz, DO

## 2023-03-30 LAB — URINE CULTURE
MICRO NUMBER:: 14971354
SPECIMEN QUALITY:: ADEQUATE

## 2023-03-31 LAB — EPSTEIN-BARR VIRUS VCA ANTIBODY PANEL
EBV NA IgG: >600 U/mL — ABNORMAL HIGH
EBV VCA IgG: 591 U/mL — ABNORMAL HIGH
EBV VCA IgM: <36 U/mL

## 2023-04-01 LAB — CMV ABS, IGG+IGM (CYTOMEGALOVIRUS)
CMV IgM: <30 AU/mL
Cytomegalovirus Ab-IgG: >10 U/mL — ABNORMAL HIGH

## 2023-04-02 LAB — CBC+PLATELET+HEM REVIEW
BASO(ABSOLUTE): 0 10*3/uL (ref 0.0–0.2)
Basophils Manual: 0 %
EOS (ABSOLUTE VALUE): 0.2 10*3/uL (ref 0.0–0.4)
Eosinophils Manual: 3 %
Hematocrit: 39.9 % (ref 34.0–46.6)
Hemoglobin: 13.3 g/dL (ref 11.1–15.9)
Lymphocytes Absolute: 3.1 10*3/uL (ref 0.7–3.1)
Lymphocytes Manual: 50 %
MCH: 28.1 pg (ref 26.6–33.0)
MCHC: 33.3 g/dL (ref 31.5–35.7)
MCV: 84 fL (ref 79–97)
Monocytes Manual: 7 %
Monocytes(Absolute): 0.4 10*3/uL (ref 0.1–0.9)
Neutrophils Absolute: 2.4 10*3/uL (ref 1.4–7.0)
Neutrophils Manual: 40 %
PLATELET COMMENT: ADEQUATE
Platelets: 244 10*3/uL (ref 150–450)
RBC COMMENT: NORMAL
RBC: 4.73 x10E6/uL (ref 3.77–5.28)
RDW: 14.4 % (ref 11.7–15.4)
WBC: 6.1 10*3/uL (ref 3.4–10.8)

## 2023-05-26 ENCOUNTER — Other Ambulatory Visit: Payer: Self-pay | Admitting: Family Medicine

## 2023-05-26 DIAGNOSIS — Z0000 Encounter for general adult medical examination without abnormal findings: Secondary | ICD-10-CM

## 2023-05-26 DIAGNOSIS — I10 Essential (primary) hypertension: Secondary | ICD-10-CM

## 2023-07-04 ENCOUNTER — Encounter (HOSPITAL_BASED_OUTPATIENT_CLINIC_OR_DEPARTMENT_OTHER): Payer: Self-pay | Admitting: Student

## 2023-07-04 ENCOUNTER — Ambulatory Visit (INDEPENDENT_AMBULATORY_CARE_PROVIDER_SITE_OTHER): Payer: No Typology Code available for payment source

## 2023-07-04 ENCOUNTER — Ambulatory Visit (HOSPITAL_BASED_OUTPATIENT_CLINIC_OR_DEPARTMENT_OTHER): Payer: No Typology Code available for payment source | Admitting: Student

## 2023-07-04 DIAGNOSIS — M25562 Pain in left knee: Secondary | ICD-10-CM

## 2023-07-04 NOTE — Progress Notes (Signed)
Chief Complaint: Left knee pain     History of Present Illness:    Jodi Rodriguez is a 62 y.o. female presenting today with left knee pain.  This began about 1 month ago without any known injury.  She does recall exercising at the gym a day or two before pain began and notably was using the elliptical machine as she usually walks on the treadmill.  Pain mainly began in the back of the knee and has now moved into the medial aspect.  This ranges from mild to moderate in severity.  Pain is worsened with going up or down stairs and after longer periods of inactivity.  Has tried resting and icing the knee, as well as taking Tylenol and ibuprofen.  Denies any other treatment at this time.  No previous history of injury or surgery.   Surgical History:   None  PMH/PSH/Family History/Social History/Meds/Allergies:    Past Medical History:  Diagnosis Date   Anxiety    Fibroid    16 WK SIZE UT   Hyperlipidemia    Hypertension    Sleep apnea    [facility]- CPAP   Past Surgical History:  Procedure Laterality Date   BREAST BIOPSY  1981   BREAST BIOPSY   Social History   Socioeconomic History   Marital status: Married    Spouse name: Not on file   Number of children: Not on file   Years of education: Not on file   Highest education level: Not on file  Occupational History   Occupation: Insurance  Tobacco Use   Smoking status: Never   Smokeless tobacco: Never  Vaping Use   Vaping status: Never Used  Substance and Sexual Activity   Alcohol use: Yes    Comment: occas   Drug use: No   Sexual activity: Yes    Partners: Male    Birth control/protection: Other-see comments    Comment: vasectomy- fewer than 5 partners first sexual  active at age 32  Other Topics Concern   Not on file  Social History Narrative   Exercise-- 2-4x a week -- walking    Social Determinants of Health   Financial Resource Strain: Not on file  Food  Insecurity: Not on file  Transportation Needs: Not on file  Physical Activity: Not on file  Stress: Not on file  Social Connections: Not on file   Family History  Problem Relation Age of Onset   Thyroid disease Sister    Hypertension Sister    Diabetes Father    Hypertension Father    Hypertension Mother    Alzheimer's disease Mother    Alzheimer's disease Other        and neurological disorder   No Known Allergies Current Outpatient Medications  Medication Sig Dispense Refill   amLODipine (NORVASC) 10 MG tablet Take 1 tablet (10 mg total) by mouth daily. 90 tablet 1   Ascorbic Acid (VITAMIN C PO) Take by mouth daily.       famotidine (PEPCID) 20 MG tablet TAKE 1 TABLET BY MOUTH TWICE A DAY 180 tablet 1   metoprolol (TOPROL-XL) 200 MG 24 hr tablet Take 1 tablet (200 mg total) by mouth daily. 90 tablet 1   Multiple Vitamin (MULTIVITAMIN) tablet Take 1 tablet by mouth daily.  rosuvastatin (CRESTOR) 10 MG tablet Take 1 tablet (10 mg total) by mouth at bedtime. 90 tablet 3   No current facility-administered medications for this visit.   No results found.  Review of Systems:   A ROS was performed including pertinent positives and negatives as documented in the HPI.  Physical Exam :   Constitutional: NAD and appears stated age Neurological: Alert and oriented Psych: Appropriate affect and cooperative Last menstrual period 02/26/2014.   Comprehensive Musculoskeletal Exam:    No evidence of soft tissue edema or palpable effusion.  No medial or lateral joint line tenderness.  Posterior knee is nontender with no Baker's cyst present.  Active range of motion from 0 to 130 degrees with no notable crepitus.  No instability with varus or valgus stress and negative Lachman.  Imaging:   Xray (left knee 4 views): Slight medial joint space narrowing with small joint line and patellar osteophytes.    I personally reviewed and interpreted the radiographs.   Assessment:   62  y.o. female with 1 month history of atraumatic left knee pain.  Radiographs taken today do show evidence of mild, very early stage osteoarthritis.  Her symptoms do seem consistent with some form of patellofemoral pain.  Discussed that this could be a result of patellofemoral syndrome versus symptomatic arthritis.  Discussion of treatment options, we will proceed with conservative management including home strengthening exercises and anti-inflammatories as needed.  If symptoms continue, likely neck step in management would be a cortisone injection.  She will continue to monitor symptoms and let me know if she would like to proceed with this.  Plan :    -Return to clinic as needed     I personally saw and evaluated the patient, and participated in the management and treatment plan.  Hazle Nordmann, PA-C Orthopedics  This document was dictated using Conservation officer, historic buildings. A reasonable attempt at proof reading has been made to minimize errors.

## 2023-09-09 ENCOUNTER — Other Ambulatory Visit: Payer: Self-pay | Admitting: Family Medicine

## 2023-09-09 DIAGNOSIS — I10 Essential (primary) hypertension: Secondary | ICD-10-CM

## 2023-09-09 DIAGNOSIS — Z0000 Encounter for general adult medical examination without abnormal findings: Secondary | ICD-10-CM

## 2023-09-25 ENCOUNTER — Ambulatory Visit: Payer: No Typology Code available for payment source | Admitting: Nurse Practitioner

## 2023-09-26 ENCOUNTER — Encounter: Payer: Self-pay | Admitting: Family Medicine

## 2023-09-26 ENCOUNTER — Ambulatory Visit: Payer: No Typology Code available for payment source | Admitting: Family Medicine

## 2023-09-26 VITALS — BP 132/80 | HR 57 | Temp 98.4°F | Resp 18 | Ht 61.0 in | Wt 205.4 lb

## 2023-09-26 DIAGNOSIS — I10 Essential (primary) hypertension: Secondary | ICD-10-CM | POA: Diagnosis not present

## 2023-09-26 DIAGNOSIS — Z0000 Encounter for general adult medical examination without abnormal findings: Secondary | ICD-10-CM | POA: Diagnosis not present

## 2023-09-26 DIAGNOSIS — E785 Hyperlipidemia, unspecified: Secondary | ICD-10-CM | POA: Diagnosis not present

## 2023-09-26 DIAGNOSIS — E2839 Other primary ovarian failure: Secondary | ICD-10-CM

## 2023-09-26 LAB — CBC WITH DIFFERENTIAL/PLATELET
Basophils Absolute: 0 10*3/uL (ref 0.0–0.1)
Basophils Relative: 0.4 % (ref 0.0–3.0)
Eosinophils Absolute: 0.1 10*3/uL (ref 0.0–0.7)
Eosinophils Relative: 2.2 % (ref 0.0–5.0)
HCT: 41.4 % (ref 36.0–46.0)
Hemoglobin: 13.5 g/dL (ref 12.0–15.0)
Lymphocytes Relative: 43.6 % (ref 12.0–46.0)
Lymphs Abs: 2.6 10*3/uL (ref 0.7–4.0)
MCHC: 32.7 g/dL (ref 30.0–36.0)
MCV: 87.1 fL (ref 78.0–100.0)
Monocytes Absolute: 0.4 10*3/uL (ref 0.1–1.0)
Monocytes Relative: 6.3 % (ref 3.0–12.0)
Neutro Abs: 2.8 10*3/uL (ref 1.4–7.7)
Neutrophils Relative %: 47.5 % (ref 43.0–77.0)
Platelets: 283 10*3/uL (ref 150.0–400.0)
RBC: 4.76 Mil/uL (ref 3.87–5.11)
RDW: 14.9 % (ref 11.5–15.5)
WBC: 5.9 10*3/uL (ref 4.0–10.5)

## 2023-09-26 LAB — COMPREHENSIVE METABOLIC PANEL
ALT: 18 U/L (ref 0–35)
AST: 21 U/L (ref 0–37)
Albumin: 4.6 g/dL (ref 3.5–5.2)
Alkaline Phosphatase: 73 U/L (ref 39–117)
BUN: 12 mg/dL (ref 6–23)
CO2: 28 meq/L (ref 19–32)
Calcium: 9.7 mg/dL (ref 8.4–10.5)
Chloride: 105 meq/L (ref 96–112)
Creatinine, Ser: 0.96 mg/dL (ref 0.40–1.20)
GFR: 64.48 mL/min (ref >60.00)
Glucose, Bld: 98 mg/dL (ref 70–99)
Potassium: 4.3 meq/L (ref 3.5–5.1)
Sodium: 142 meq/L (ref 135–145)
Total Bilirubin: 0.6 mg/dL (ref 0.2–1.2)
Total Protein: 7.5 g/dL (ref 6.0–8.3)

## 2023-09-26 LAB — LIPID PANEL
Cholesterol: 262 mg/dL — ABNORMAL HIGH (ref 0–200)
HDL: 70.9 mg/dL (ref >39.00)
LDL Cholesterol: 178 mg/dL — ABNORMAL HIGH (ref 0–99)
NonHDL: 190.92
Total CHOL/HDL Ratio: 4
Triglycerides: 64 mg/dL (ref 0.0–149.0)
VLDL: 12.8 mg/dL (ref 0.0–40.0)

## 2023-09-26 LAB — TSH: TSH: 1.83 u[IU]/mL (ref 0.35–5.50)

## 2023-09-26 MED ORDER — AMLODIPINE BESYLATE 10 MG PO TABS
10.0000 mg | ORAL_TABLET | Freq: Every day | ORAL | 1 refills | Status: DC
Start: 2023-09-26 — End: 2024-04-02

## 2023-09-26 MED ORDER — ROSUVASTATIN CALCIUM 10 MG PO TABS
10.0000 mg | ORAL_TABLET | Freq: Every evening | ORAL | 1 refills | Status: DC
Start: 2023-09-26 — End: 2023-10-15

## 2023-09-26 MED ORDER — METOPROLOL SUCCINATE ER 200 MG PO TB24
200.0000 mg | ORAL_TABLET | Freq: Every day | ORAL | 1 refills | Status: DC
Start: 2023-09-26 — End: 2024-04-02

## 2023-09-26 NOTE — Assessment & Plan Note (Signed)
Ghm utd Check labs  See AVS  Health Maintenance  Topic Date Due   Zoster Vaccines- Shingrix (1 of 2) Never done   DTaP/Tdap/Td (3 - Tdap) 05/24/2018   MAMMOGRAM  11/15/2022   COVID-19 Vaccine (3 - 2023-24 season) 07/14/2023   INFLUENZA VACCINE  02/10/2024 (Originally 06/13/2023)   Cervical Cancer Screening (HPV/Pap Cotest)  08/03/2024   Colonoscopy  05/09/2027   Hepatitis C Screening  Completed   HIV Screening  Completed   HPV VACCINES  Aged Out

## 2023-09-26 NOTE — Assessment & Plan Note (Signed)
Well controlled, no changes to meds. Encouraged heart healthy diet such as the DASH diet and exercise as tolerated.  °

## 2023-09-26 NOTE — Assessment & Plan Note (Signed)
Encourage heart healthy diet such as MIND or DASH diet, increase exercise, avoid trans fats, simple carbohydrates and processed foods, consider a krill or fish or flaxseed oil cap daily.  °

## 2023-09-26 NOTE — Progress Notes (Signed)
Established Patient Office Visit  Subjective   Patient ID: Jodi Rodriguez, female    DOB: [birth year]  Age: 62 y.o. MRN: [id number]  Chief Complaint  Patient presents with   Annual Exam    Pt states fasting     HPI Discussed the use of AI scribe software for clinical note transcription with the patient, who gave verbal consent to proceed.  History of Present Illness   The patient, with a history of hypertension, presented for a routine physical examination. She reported no new health issues since the last visit. The patient has been compliant with her current medication regimen, which includes amlodipine, toprolol, and rosuvastatin, although she admitted to not being consistent with the rosuvastatin.  She reported no new surgeries or changes in her family history.  The patient has been maintaining an active lifestyle, walking two to four times a week and recently started a weightlifting class.  The patient reported no issues with her joints, stomach, or any other body part. She has been seeing a gynecologist regularly and has a scheduled appointment next week.      Patient Active Problem List   Diagnosis Date Noted   Sleep apnea 09/20/2022   Epigastric abdominal tenderness without rebound tenderness 09/19/2021   Preventative health care 06/08/2019   Hot flashes due to menopause 01/02/2017   Postmenopausal bleeding 10/08/2016   Finger wound, simple, open 11/25/2013   Obesity (BMI 30-39.9) 07/28/2013   Fibroid    UTI 08/15/2010   Anxiety 04/21/2009   CERVICAL LYMPHADENOPATHY, LEFT 02/25/2008   Impacted cerumen 06/09/2007   Hyperlipidemia 02/20/2007   Essential hypertension 02/20/2007   Past Medical History:  Diagnosis Date   Anxiety    Fibroid    16 WK SIZE UT   Hyperlipidemia    Hypertension    Sleep apnea    [facility]- CPAP   Past Surgical History:  Procedure Laterality Date   BREAST BIOPSY  1981   BREAST BIOPSY   Social History   Tobacco Use    Smoking status: Never   Smokeless tobacco: Never  Vaping Use   Vaping status: Never Used  Substance Use Topics   Alcohol use: Yes    Comment: occas   Drug use: No   Social History   Socioeconomic History   Marital status: Married    Spouse name: Not on file   Number of children: Not on file   Years of education: Not on file   Highest education level: Not on file  Occupational History   Occupation: Insurance  Tobacco Use   Smoking status: Never   Smokeless tobacco: Never  Vaping Use   Vaping status: Never Used  Substance and Sexual Activity   Alcohol use: Yes    Comment: occas   Drug use: No   Sexual activity: Yes    Partners: Male    Birth control/protection: Other-see comments    Comment: vasectomy- fewer than 5 partners first sexual  active at age 10  Other Topics Concern   Not on file  Social History Narrative   Exercise-- 2-4x a week -- walking    Social Determinants of Health   Financial Resource Strain: Not on file  Food Insecurity: Not on file  Transportation Needs: Not on file  Physical Activity: Not on file  Stress: Not on file  Social Connections: Not on file  Intimate Partner Violence: Not on file   Family Status  Relation Name Status   Sister  Alive  Father  Deceased   Mother  Deceased   Other  (Not Specified)  No partnership data on file   Family History  Problem Relation Age of Onset   Thyroid disease Sister    Hypertension Sister    Diabetes Father    Hypertension Father    Hypertension Mother    Alzheimer's disease Mother    Alzheimer's disease Other        and neurological disorder   No Known Allergies  Review of Systems  Constitutional:  Negative for chills, fever and malaise/fatigue.  HENT:  Negative for congestion and hearing loss.   Eyes:  Negative for blurred vision and discharge.  Respiratory:  Negative for cough, sputum production and shortness of breath.   Cardiovascular:  Negative for chest pain, palpitations and leg  swelling.  Gastrointestinal:  Negative for abdominal pain, blood in stool, constipation, diarrhea, heartburn, nausea and vomiting.  Genitourinary:  Negative for dysuria, frequency, hematuria and urgency.  Musculoskeletal:  Negative for back pain, falls and myalgias.  Skin:  Negative for rash.  Neurological:  Negative for dizziness, sensory change, loss of consciousness, weakness and headaches.  Endo/Heme/Allergies:  Negative for environmental allergies. Does not bruise/bleed easily.  Psychiatric/Behavioral:  Negative for depression and suicidal ideas. The patient is not nervous/anxious and does not have insomnia.       Objective:     BP 132/80 (BP Location: Left Arm, Patient Position: Sitting, Cuff Size: Large)   Pulse (!) 57   Temp 98.4 F (36.9 C) (Oral)   Resp 18   Ht 5\' 1"  (1.549 m)   Wt 205 lb 6.4 oz (93.2 kg)   LMP 02/26/2014   SpO2 97%   BMI 38.81 kg/m  BP Readings from Last 3 Encounters:  09/26/23 132/80  03/29/23 112/80  02/19/23 (!) 140/78   Wt Readings from Last 3 Encounters:  09/26/23 205 lb 6.4 oz (93.2 kg)  03/29/23 222 lb 6.4 oz (100.9 kg)  02/19/23 223 lb (101.2 kg)   SpO2 Readings from Last 3 Encounters:  09/26/23 97%  03/29/23 95%  02/19/23 95%      Physical Exam Vitals and nursing note reviewed.  Constitutional:      General: She is not in acute distress.    Appearance: Normal appearance. She is well-developed.  HENT:     Head: Normocephalic and atraumatic.     Right Ear: Tympanic membrane, ear canal and external ear normal. There is no impacted cerumen.     Left Ear: Tympanic membrane, ear canal and external ear normal. There is no impacted cerumen.     Nose: Nose normal.     Mouth/Throat:     Mouth: Mucous membranes are moist.     Pharynx: Oropharynx is clear. No oropharyngeal exudate or posterior oropharyngeal erythema.  Eyes:     General: No scleral icterus.       Right eye: No discharge.        Left eye: No discharge.      Conjunctiva/sclera: Conjunctivae normal.     Pupils: Pupils are equal, round, and reactive to light.  Neck:     Thyroid: No thyromegaly or thyroid tenderness.     Vascular: No JVD.  Cardiovascular:     Rate and Rhythm: Normal rate and regular rhythm.     Heart sounds: Normal heart sounds. No murmur heard. Pulmonary:     Effort: Pulmonary effort is normal. No respiratory distress.     Breath sounds: Normal breath sounds.  Abdominal:  General: Bowel sounds are normal. There is no distension.     Palpations: Abdomen is soft. There is no mass.     Tenderness: There is no abdominal tenderness. There is no guarding or rebound.  Genitourinary:    Vagina: Normal.  Musculoskeletal:        General: Normal range of motion.     Cervical back: Normal range of motion and neck supple.     Right lower leg: No edema.     Left lower leg: No edema.  Lymphadenopathy:     Cervical: No cervical adenopathy.  Skin:    General: Skin is warm and dry.     Findings: No erythema or rash.  Neurological:     Mental Status: She is alert and oriented to person, place, and time.     Cranial Nerves: No cranial nerve deficit.     Deep Tendon Reflexes: Reflexes are normal and symmetric.  Psychiatric:        Mood and Affect: Mood normal.        Behavior: Behavior normal.        Thought Content: Thought content normal.        Judgment: Judgment normal.      No results found for any visits on 09/26/23.  Last CBC Lab Results  Component Value Date   WBC 6.1 03/29/2023   HGB 13.3 03/29/2023   HCT 39.9 03/29/2023   MCV 84 03/29/2023   MCH 28.1 03/29/2023   RDW 14.4 03/29/2023   PLT 244 03/29/2023   Last metabolic panel Lab Results  Component Value Date   GLUCOSE 103 (H) 03/29/2023   NA 141 03/29/2023   K 4.1 03/29/2023   CL 106 03/29/2023   CO2 26 03/29/2023   BUN 15 03/29/2023   CREATININE 0.80 03/29/2023   GFR 80.53 03/29/2023   CALCIUM 9.6 03/29/2023   PROT 7.3 03/29/2023   ALBUMIN 4.3  03/29/2023   BILITOT 0.5 03/29/2023   ALKPHOS 63 03/29/2023   AST 26 03/29/2023   ALT 26 03/29/2023   Last lipids Lab Results  Component Value Date   CHOL 272 (H) 09/20/2022   HDL 68.80 09/20/2022   LDLCALC 189 (H) 09/20/2022   LDLDIRECT 151.1 07/28/2013   TRIG 72.0 09/20/2022   CHOLHDL 4 09/20/2022   Last hemoglobin A1c No results found for: "HGBA1C" Last thyroid functions Lab Results  Component Value Date   TSH 2.76 03/29/2023   Last vitamin D Lab Results  Component Value Date   VD25OH 37.26 03/29/2023   Last vitamin B12 and Folate Lab Results  Component Value Date   VITAMINB12 668 03/29/2023   FOLATE >20.0 ng/mL 04/21/2009      The 10-year ASCVD risk score (Arnett DK, et al., 2019) is: 8.6%    Assessment & Plan:   Problem List Items Addressed This Visit       Unprioritized   Preventative health care - Primary    Ghm utd Check labs  See AVS  Health Maintenance  Topic Date Due   Zoster Vaccines- Shingrix (1 of 2) Never done   DTaP/Tdap/Td (3 - Tdap) 05/24/2018   MAMMOGRAM  11/15/2022   COVID-19 Vaccine (3 - 2023-24 season) 07/14/2023   INFLUENZA VACCINE  02/10/2024 (Originally 06/13/2023)   Cervical Cancer Screening (HPV/Pap Cotest)  08/03/2024   Colonoscopy  05/09/2027   Hepatitis C Screening  Completed   HIV Screening  Completed   HPV VACCINES  Aged Out         Relevant  Medications   amLODipine (NORVASC) 10 MG tablet   metoprolol (TOPROL-XL) 200 MG 24 hr tablet   Hyperlipidemia    Encourage heart healthy diet such as MIND or DASH diet, increase exercise, avoid trans fats, simple carbohydrates and processed foods, consider a krill or fish or flaxseed oil cap daily.        Relevant Medications   amLODipine (NORVASC) 10 MG tablet   metoprolol (TOPROL-XL) 200 MG 24 hr tablet   rosuvastatin (CRESTOR) 10 MG tablet   Other Relevant Orders   Comprehensive metabolic panel   Lipid panel   CT CARDIAC SCORING   Essential hypertension    Well  controlled, no changes to meds. Encouraged heart healthy diet such as the DASH diet and exercise as tolerated.        Relevant Medications   amLODipine (NORVASC) 10 MG tablet   metoprolol (TOPROL-XL) 200 MG 24 hr tablet   rosuvastatin (CRESTOR) 10 MG tablet   Other Relevant Orders   CBC with Differential/Platelet   Comprehensive metabolic panel   Lipid panel   TSH   Other Visit Diagnoses     Estrogen deficiency       Relevant Orders   DG BONE DENSITY (DXA)     Assessment and Plan    Cardiovascular Risk Assessment   Given her family history of heart disease, we discussed conducting a calcium score CT scan to assess her cardiovascular risk. This scan, costing $99, will provide a score indicating the level of calcifications around the coronary arteries, where zero suggests no cardiovascular disease and higher scores denote an increased risk. We will order the calcium score CT scan.  Hypertension   She is managing her chronic hypertension with amlodipine and toprolol, reporting no new symptoms. We will refill amlodipine and toprolol.  Hyperlipidemia   Her hyperlipidemia is managed with rosuvastatin, though she reports non-adherence to the medication. We will refill rosuvastatin and encourage adherence to the regimen.  Cerumen Impaction   Earwax buildup has been noted. We advised using Colace to soften the earwax. She should use Colace as recommended.  General Health Maintenance   She is due for several screenings and vaccinations. We will schedule a mammogram before the end of the year and a bone density scan. We encouraged regular physical activity. She declined the shingles vaccine but was informed about the importance of the tetanus shot in case of injury.  Follow-up   She is scheduled to follow up with GYN next week. Additionally, we will schedule a mammogram at the breast center.        Return in about 6 months (around 03/25/2024), or if symptoms worsen or fail to improve.     Donato Schultz, DO

## 2023-09-26 NOTE — Patient Instructions (Signed)
Preventive Care 40-60 Years Old, Female Preventive care refers to lifestyle choices and visits with your health care provider that can promote health and wellness. Preventive care visits are also called wellness exams. What can I expect for my preventive care visit? Counseling Your health care provider may ask you questions about your: Medical history, including: Past medical problems. Family medical history. Pregnancy history. Current health, including: Menstrual cycle. Method of birth control. Emotional well-being. Home life and relationship well-being. Sexual activity and sexual health. Lifestyle, including: Alcohol, nicotine or tobacco, and drug use. Access to firearms. Diet, exercise, and sleep habits. Work and work environment. Sunscreen use. Safety issues such as seatbelt and bike helmet use. Physical exam Your health care provider will check your: Height and weight. These may be used to calculate your BMI (body mass index). BMI is a measurement that tells if you are at a healthy weight. Waist circumference. This measures the distance around your waistline. This measurement also tells if you are at a healthy weight and may help predict your risk of certain diseases, such as type 2 diabetes and high blood pressure. Heart rate and blood pressure. Body temperature. Skin for abnormal spots. What immunizations do I need?  Vaccines are usually given at various ages, according to a schedule. Your health care provider will recommend vaccines for you based on your age, medical history, and lifestyle or other factors, such as travel or where you work. What tests do I need? Screening Your health care provider may recommend screening tests for certain conditions. This may include: Lipid and cholesterol levels. Diabetes screening. This is done by checking your blood sugar (glucose) after you have not eaten for a while (fasting). Pelvic exam and Pap test. Hepatitis B test. Hepatitis C  test. HIV (human immunodeficiency virus) test. STI (sexually transmitted infection) testing, if you are at risk. Lung cancer screening. Colorectal cancer screening. Mammogram. Talk with your health care provider about when you should start having regular mammograms. This may depend on whether you have a family history of breast cancer. BRCA-related cancer screening. This may be done if you have a family history of breast, ovarian, tubal, or peritoneal cancers. Bone density scan. This is done to screen for osteoporosis. Talk with your health care provider about your test results, treatment options, and if necessary, the need for more tests. Follow these instructions at home: Eating and drinking  Eat a diet that includes fresh fruits and vegetables, whole grains, lean protein, and low-fat dairy products. Take vitamin and mineral supplements as recommended by your health care provider. Do not drink alcohol if: Your health care provider tells you not to drink. You are pregnant, may be pregnant, or are planning to become pregnant. If you drink alcohol: Limit how much you have to 0-1 drink a day. Know how much alcohol is in your drink. In the [place] one drink equals one 12 oz bottle of beer (355 mL), one 5 oz glass of wine (148 mL), or one 1 oz glass of hard liquor (44 mL). Lifestyle Brush your teeth every morning and night with fluoride toothpaste. Floss one time each day. Exercise for at least 30 minutes 5 or more days each week. Do not use any products that contain nicotine or tobacco. These products include cigarettes, chewing tobacco, and vaping devices, such as e-cigarettes. If you need help quitting, ask your health care provider. Do not use drugs. If you are sexually active, practice safe sex. Use a condom or other form of protection to   prevent STIs. If you do not wish to become pregnant, use a form of birth control. If you plan to become pregnant, see your health care provider for a  prepregnancy visit. Take aspirin only as told by your health care provider. Make sure that you understand how much to take and what form to take. Work with your health care provider to find out whether it is safe and beneficial for you to take aspirin daily. Find healthy ways to manage stress, such as: Meditation, yoga, or listening to music. Journaling. Talking to a trusted person. Spending time with friends and family. Minimize exposure to UV radiation to reduce your risk of skin cancer. Safety Always wear your seat belt while driving or riding in a vehicle. Do not drive: If you have been drinking alcohol. Do not ride with someone who has been drinking. When you are tired or distracted. While texting. If you have been using any mind-altering substances or drugs. Wear a helmet and other protective equipment during sports activities. If you have firearms in your house, make sure you follow all gun safety procedures. Seek help if you have been physically or sexually abused. What's next? Visit your health care provider once a year for an annual wellness visit. Ask your health care provider how often you should have your eyes and teeth checked. Stay up to date on all vaccines. This information is not intended to replace advice given to you by your health care provider. Make sure you discuss any questions you have with your health care provider. Document Revised: 04/26/2021 Document Reviewed: 04/26/2021 Elsevier Patient Education  2024 Elsevier Inc.  

## 2023-10-01 ENCOUNTER — Ambulatory Visit (INDEPENDENT_AMBULATORY_CARE_PROVIDER_SITE_OTHER): Payer: No Typology Code available for payment source | Admitting: Nurse Practitioner

## 2023-10-01 ENCOUNTER — Encounter: Payer: Self-pay | Admitting: Nurse Practitioner

## 2023-10-01 VITALS — BP 144/88 | HR 61 | Ht 62.5 in | Wt 206.0 lb

## 2023-10-01 DIAGNOSIS — Z780 Asymptomatic menopausal state: Secondary | ICD-10-CM | POA: Diagnosis not present

## 2023-10-01 DIAGNOSIS — Z01419 Encounter for gynecological examination (general) (routine) without abnormal findings: Secondary | ICD-10-CM

## 2023-10-01 DIAGNOSIS — K644 Residual hemorrhoidal skin tags: Secondary | ICD-10-CM

## 2023-10-01 NOTE — Progress Notes (Signed)
DILLON ALDIS July 09, 1961 [id number]   History:  62 y.o. G2P2 presents for annual exam. No GYN complaints. Postmenopausal - no HRT, no bleeding. Occasional hot flashes. Normal pap and mammogram history. HTN, HLD managed by PCP. Has a hemorrhoid, has improved some with OTC treatment. Was having some constipation prior, also now doing weight lifting.   Gynecologic History Patient's last menstrual period was 02/26/2014.   Contraception: post menopausal status Sexually active: Yes  Health maintenance Last Pap: 08/04/2019. Results were: Normal neg HPV Last mammogram: 11/15/2021. Results were: Normal Last colonoscopy: 05/08/2017. Results were: Normal, 10-year recall Last Dexa: Never  Past medical history, past surgical history, family history and social history were all reviewed and documented in the EPIC chart. Married. Works remote for [employer]. 14 yo son, lives in [place]. 28 to daughter in [place].   ROS:  A ROS was performed and pertinent positives and negatives are included.  Exam:  Vitals:   10/01/23 1453  BP: (!) 144/88  Pulse: 61  SpO2: 97%  Weight: 206 lb (93.4 kg)  Height: 5' 2.5" (1.588 m)      Body mass index is 37.08 kg/m.  General appearance:  Normal Thyroid:  Symmetrical, normal in size, without palpable masses or nodularity. Respiratory  Auscultation:  Clear without wheezing or rhonchi Cardiovascular  Auscultation:  Regular rate, without rubs, murmurs or gallops  Edema/varicosities:  Not grossly evident Abdominal  Soft,nontender, without masses, guarding or rebound.  Liver/spleen:  No organomegaly noted  Hernia:  None appreciated  Skin  Inspection:  Grossly normal   Breasts: Examined lying and sitting.   Right: Without masses, retractions, discharge or axillary adenopathy.   Left: Without masses, retractions, discharge or axillary adenopathy. Pelvic: External genitalia:  no lesions, dermatitis extending from perineum to anus               Urethra:  normal appearing urethra with no masses, tenderness or lesions              Bartholins and Skenes: normal                 Vagina: normal appearing vagina with normal color and discharge, no lesions              Cervix: no lesions Bimanual Exam:  Uterus:  no masses or tenderness              Adnexa: no mass, fullness, tenderness              Rectovaginal: Deferred              Anus:  ~1 cm non-bleeding hemorrhoid  Patient informed chaperone available to be present for breast and pelvic exam. Patient has requested no chaperone to be present. Patient has been advised what will be completed during breast and pelvic exam.   Assessment/Plan:  62 y.o. G2P2 for annual exam.   Well female exam with routine gynecological exam - Education provided on SBEs, importance of preventative screenings, current guidelines, high calcium diet, regular exercise, and multivitamin daily.  Labs with PCP.  Postmenopausal - no HRT, no bleeding. Mild hot flashes.   External hemorrhoid - non-bleeding, some improvement with OTC treatment. Avoid straining, vigorous wiping/friction. Apply Preparation-H. Used Hydrocortisone to surrounding tissue that now appears to be irritated/raw. Recommend barrier cream such as Aquafor or Desitin.   Screening for cervical cancer - Normal Pap history.  Will repeat at 5-year interval per guidelines.  Screening for breast cancer - Normal mammogram  history.  Continue annual screenings. Normal breast exam today.  Screening for colon cancer - 2018 colonoscopy. Will repeat at GI's recommended interval.   Screening for osteoporosis - DXA scheduled 12/02/22.   Return in about 1 year (around 09/30/2024) for Annual.      Olivia Mackie [facility], 3:11 PM 10/01/2023

## 2023-10-03 ENCOUNTER — Ambulatory Visit (HOSPITAL_BASED_OUTPATIENT_CLINIC_OR_DEPARTMENT_OTHER)
Admission: RE | Admit: 2023-10-03 | Discharge: 2023-10-03 | Disposition: A | Discharge status: Home / Self Care | Payer: No Typology Code available for payment source | Source: Ambulatory Visit | Attending: Family Medicine | Admitting: Family Medicine

## 2023-10-03 DIAGNOSIS — E2839 Other primary ovarian failure: Secondary | ICD-10-CM | POA: Insufficient documentation

## 2023-10-03 DIAGNOSIS — E785 Hyperlipidemia, unspecified: Secondary | ICD-10-CM | POA: Insufficient documentation

## 2023-10-14 ENCOUNTER — Telehealth: Payer: Self-pay | Admitting: Family Medicine

## 2023-10-14 DIAGNOSIS — E785 Hyperlipidemia, unspecified: Secondary | ICD-10-CM

## 2023-10-14 NOTE — Telephone Encounter (Signed)
Pt states pharmacy never received a request for Crestor and she is needing it filled.    rosuvastatin (CRESTOR) 10 MG tablet [   [id number] - [place], [place] - [address] [place] [address] Phone: [phone]  Fax: [phone]

## 2023-10-15 MED ORDER — ROSUVASTATIN CALCIUM 10 MG PO TABS
10.0000 mg | ORAL_TABLET | Freq: Every evening | ORAL | 1 refills | Status: DC
Start: 2023-10-15 — End: 2024-04-02

## 2023-10-15 NOTE — Telephone Encounter (Signed)
Refills resent.

## 2023-12-05 ENCOUNTER — Ambulatory Visit (HOSPITAL_BASED_OUTPATIENT_CLINIC_OR_DEPARTMENT_OTHER): Payer: No Typology Code available for payment source

## 2023-12-05 ENCOUNTER — Ambulatory Visit (HOSPITAL_BASED_OUTPATIENT_CLINIC_OR_DEPARTMENT_OTHER): Payer: No Typology Code available for payment source | Admitting: Orthopaedic Surgery

## 2023-12-05 DIAGNOSIS — M25531 Pain in right wrist: Secondary | ICD-10-CM

## 2023-12-05 DIAGNOSIS — M25562 Pain in left knee: Secondary | ICD-10-CM | POA: Diagnosis not present

## 2023-12-05 NOTE — Progress Notes (Signed)
Chief Complaint: Left knee pain        History of Present Illness:    12/05/2023: Presents today for follow-up of her left knee pain.  She is still having medial based knee pain.  With the right wrist her complaint is predominantly loss of motion with loss of extension.  She denies any specific trauma or injury to this.   Jodi Rodriguez is a 63 y.o. female presenting today with left knee pain.  This began about 1 month ago without any known injury.  She does recall exercising at the gym a day or two before pain began and notably was using the elliptical machine as she usually walks on the treadmill.  Pain mainly began in the back of the knee and has now moved into the medial aspect.  This ranges from mild to moderate in severity.  Pain is worsened with going up or down stairs and after longer periods of inactivity.  Has tried resting and icing the knee, as well as taking Tylenol and ibuprofen.  Denies any other treatment at this time.  No previous history of injury or surgery.     Surgical History:   None   PMH/PSH/Family History/Social History/Meds/Allergies:         Past Medical History:  Diagnosis Date   Anxiety     Fibroid      16 WK SIZE UT   Hyperlipidemia     Hypertension     Sleep apnea      [facility]- CPAP             Past Surgical History:  Procedure Laterality Date   BREAST BIOPSY   1981    BREAST BIOPSY        Social History         Socioeconomic History   Marital status: Married      Spouse name: Not on file   Number of children: Not on file   Years of education: Not on file   Highest education level: Not on file  Occupational History   Occupation: Insurance  Tobacco Use   Smoking status: Never   Smokeless tobacco: Never  Vaping Use   Vaping status: Never Used  Substance and Sexual Activity   Alcohol use: Yes      Comment: occas   Drug use: No   Sexual activity: Yes      Partners: Male      Birth control/protection:  Other-see comments      Comment: vasectomy- fewer than 5 partners first sexual  active at age 44  Other Topics Concern   Not on file  Social History Narrative    Exercise-- 2-4x a week -- walking     Social Determinants of Health    Financial Resource Strain: Not on file  Food Insecurity: Not on file  Transportation Needs: Not on file  Physical Activity: Not on file  Stress: Not on file  Social Connections: Not on file         Family History  Problem Relation Age of Onset   Thyroid disease Sister     Hypertension Sister     Diabetes Father     Hypertension Father     Hypertension Mother     Alzheimer's disease Mother     Alzheimer's disease Other          and neurological disorder        Allergies  No Known Allergies  Current Outpatient Medications  Medication Sig Dispense Refill   amLODipine (NORVASC) 10 MG tablet Take 1 tablet (10 mg total) by mouth daily. 90 tablet 1   Ascorbic Acid (VITAMIN C PO) Take by mouth daily.         famotidine (PEPCID) 20 MG tablet TAKE 1 TABLET BY MOUTH TWICE A DAY 180 tablet 1   metoprolol (TOPROL-XL) 200 MG 24 hr tablet Take 1 tablet (200 mg total) by mouth daily. 90 tablet 1   Multiple Vitamin (MULTIVITAMIN) tablet Take 1 tablet by mouth daily.         rosuvastatin (CRESTOR) 10 MG tablet Take 1 tablet (10 mg total) by mouth at bedtime. 90 tablet 3      No current facility-administered medications for this visit.      Imaging Results (Last 48 hours)  No results found.     Review of Systems:   A ROS was performed including pertinent positives and negatives as documented in the HPI.   Physical Exam :   Constitutional: NAD and appears stated age Neurological: Alert and oriented Psych: Appropriate affect and cooperative Last menstrual period 02/26/2014.    Comprehensive Musculoskeletal Exam:     No evidence of soft tissue edema or palpable effusion.  No medial or lateral joint line tenderness.  Posterior knee is  nontender with no Baker's cyst present.  Active range of motion from 0 to 130 degrees with no notable crepitus.  No instability with varus or valgus stress and negative Lachman.     Imaging:   Xray (left knee 4 views): Slight medial joint space narrowing with small joint line and patellar osteophytes.       I personally reviewed and interpreted the radiographs.     Assessment:   63 y.o. female with 1 month history of atraumatic left knee pain.  Given the that she has not had any improvement from activity restriction as well as anti-inflammatories I would recommend an MRI of the left knee so we can rule out an underlying meniscal tear.  Will plan to proceed with this.  With regard to the right wrist I do believe that this could be evidence of early Kienbck's disease given her loss of motion and atraumatic nature.  There is some cystic changes in the lunate.  I did describe that ultimately an MRI would be needed to rule this in or out although she would like to defer this today as her left knee is her predominant cause of pain   Plan :     -Plan for MRI left knee and follow-up to discuss result         I personally saw and evaluated the patient, and participated in the management and treatment plan.

## 2023-12-21 ENCOUNTER — Ambulatory Visit
Admission: RE | Admit: 2023-12-21 | Discharge: 2023-12-21 | Disposition: A | Discharge status: Home / Self Care | Payer: No Typology Code available for payment source | Source: Ambulatory Visit | Attending: Orthopaedic Surgery | Admitting: Orthopaedic Surgery

## 2023-12-21 DIAGNOSIS — M25562 Pain in left knee: Secondary | ICD-10-CM

## 2024-01-02 ENCOUNTER — Ambulatory Visit (HOSPITAL_BASED_OUTPATIENT_CLINIC_OR_DEPARTMENT_OTHER): Payer: No Typology Code available for payment source | Admitting: Orthopaedic Surgery

## 2024-01-07 ENCOUNTER — Other Ambulatory Visit: Payer: No Typology Code available for payment source

## 2024-01-08 ENCOUNTER — Ambulatory Visit (HOSPITAL_BASED_OUTPATIENT_CLINIC_OR_DEPARTMENT_OTHER): Payer: No Typology Code available for payment source | Admitting: Orthopaedic Surgery

## 2024-01-08 DIAGNOSIS — S83242D Other tear of medial meniscus, current injury, left knee, subsequent encounter: Secondary | ICD-10-CM

## 2024-01-08 NOTE — Progress Notes (Signed)
 Chief Complaint: Left knee pain        History of Present Illness:    01/08/2024: Presents today for follow-up of her left knee pain.  She is here today for MRI discussion  Jodi Rodriguez is a 63 y.o. female presenting today with left knee pain.  This began about 1 month ago without any known injury.  She does recall exercising at the gym a day or two before pain began and notably was using the elliptical machine as she usually walks on the treadmill.  Pain mainly began in the back of the knee and has now moved into the medial aspect.  This ranges from mild to moderate in severity.  Pain is worsened with going up or down stairs and after longer periods of inactivity.  Has tried resting and icing the knee, as well as taking Tylenol and ibuprofen.  Denies any other treatment at this time.  No previous history of injury or surgery.     Surgical History:   None   PMH/PSH/Family History/Social History/Meds/Allergies:         Past Medical History:  Diagnosis Date   Anxiety     Fibroid      16 WK SIZE UT   Hyperlipidemia     Hypertension     Sleep apnea      [facility]- CPAP             Past Surgical History:  Procedure Laterality Date   BREAST BIOPSY   1981    BREAST BIOPSY        Social History         Socioeconomic History   Marital status: Married      Spouse name: Not on file   Number of children: Not on file   Years of education: Not on file   Highest education level: Not on file  Occupational History   Occupation: Insurance  Tobacco Use   Smoking status: Never   Smokeless tobacco: Never  Vaping Use   Vaping status: Never Used  Substance and Sexual Activity   Alcohol use: Yes      Comment: occas   Drug use: No   Sexual activity: Yes      Partners: Male      Birth control/protection: Other-see comments      Comment: vasectomy- fewer than 5 partners first sexual  active at age 38  Other Topics Concern   Not on file  Social History  Narrative    Exercise-- 2-4x a week -- walking     Social Determinants of Health    Financial Resource Strain: Not on file  Food Insecurity: Not on file  Transportation Needs: Not on file  Physical Activity: Not on file  Stress: Not on file  Social Connections: Not on file         Family History  Problem Relation Age of Onset   Thyroid disease Sister     Hypertension Sister     Diabetes Father     Hypertension Father     Hypertension Mother     Alzheimer's disease Mother     Alzheimer's disease Other          and neurological disorder        Allergies  No Known Allergies         Current Outpatient Medications  Medication Sig Dispense Refill   amLODipine (NORVASC) 10 MG tablet Take 1 tablet (10 mg total) by mouth  daily. 90 tablet 1   Ascorbic Acid (VITAMIN C PO) Take by mouth daily.         famotidine (PEPCID) 20 MG tablet TAKE 1 TABLET BY MOUTH TWICE A DAY 180 tablet 1   metoprolol (TOPROL-XL) 200 MG 24 hr tablet Take 1 tablet (200 mg total) by mouth daily. 90 tablet 1   Multiple Vitamin (MULTIVITAMIN) tablet Take 1 tablet by mouth daily.         rosuvastatin (CRESTOR) 10 MG tablet Take 1 tablet (10 mg total) by mouth at bedtime. 90 tablet 3      No current facility-administered medications for this visit.      Imaging Results (Last 48 hours)  No results found.     Review of Systems:   A ROS was performed including pertinent positives and negatives as documented in the HPI.   Physical Exam :   Constitutional: NAD and appears stated age Neurological: Alert and oriented Psych: Appropriate affect and cooperative Last menstrual period 02/26/2014.    Comprehensive Musculoskeletal Exam:     No evidence of soft tissue edema or palpable effusion.  No medial or lateral joint line tenderness.  Posterior knee is nontender with no Baker's cyst present.  Active range of motion from 0 to 130 degrees with no notable crepitus.  No instability with varus or valgus stress and  negative Lachman.     Imaging:   Xray (left knee 4 views): Slight medial joint space narrowing with small joint line and patellar osteophytes.       I personally reviewed and interpreted the radiographs.     Assessment:   63 y.o. female with 1 month history of atraumatic left knee pain.  At this time she does have a medial meniscal tear.  I did discuss potential surgical intervention and arthroscopic repair.  I did discuss risks and limitations.  She would like to further consider this.  She will send [place] a MyChart message after she is further considering   Plan :     -Plan for possible left knee medial meniscal repair         I personally saw and evaluated the patient, and participated in the management and treatment plan.

## 2024-01-14 ENCOUNTER — Other Ambulatory Visit: Payer: Self-pay | Admitting: *Deleted

## 2024-01-14 DIAGNOSIS — E785 Hyperlipidemia, unspecified: Secondary | ICD-10-CM

## 2024-01-14 DIAGNOSIS — I10 Essential (primary) hypertension: Secondary | ICD-10-CM

## 2024-01-14 NOTE — Progress Notes (Signed)
 ts

## 2024-01-15 ENCOUNTER — Other Ambulatory Visit (INDEPENDENT_AMBULATORY_CARE_PROVIDER_SITE_OTHER): Payer: No Typology Code available for payment source

## 2024-01-15 DIAGNOSIS — I10 Essential (primary) hypertension: Secondary | ICD-10-CM | POA: Diagnosis not present

## 2024-01-15 DIAGNOSIS — E785 Hyperlipidemia, unspecified: Secondary | ICD-10-CM

## 2024-01-15 LAB — COMPREHENSIVE METABOLIC PANEL
ALT: 17 U/L (ref 0–35)
AST: 19 U/L (ref 0–37)
Albumin: 4.4 g/dL (ref 3.5–5.2)
Alkaline Phosphatase: 67 U/L (ref 39–117)
BUN: 19 mg/dL (ref 6–23)
CO2: 26 meq/L (ref 19–32)
Calcium: 9.6 mg/dL (ref 8.4–10.5)
Chloride: 108 meq/L (ref 96–112)
Creatinine, Ser: 0.79 mg/dL (ref 0.40–1.20)
GFR: 81.3 mL/min (ref >60.00)
Glucose, Bld: 103 mg/dL — ABNORMAL HIGH (ref 70–99)
Potassium: 3.9 meq/L (ref 3.5–5.1)
Sodium: 143 meq/L (ref 135–145)
Total Bilirubin: 0.4 mg/dL (ref 0.2–1.2)
Total Protein: 7.2 g/dL (ref 6.0–8.3)

## 2024-01-15 LAB — CBC WITH DIFFERENTIAL/PLATELET
Basophils Absolute: 0 10*3/uL (ref 0.0–0.1)
Basophils Relative: 0.4 % (ref 0.0–3.0)
Eosinophils Absolute: 0.2 10*3/uL (ref 0.0–0.7)
Eosinophils Relative: 2.3 % (ref 0.0–5.0)
HCT: 39.3 % (ref 36.0–46.0)
Hemoglobin: 12.9 g/dL (ref 12.0–15.0)
Lymphocytes Relative: 53 % — ABNORMAL HIGH (ref 12.0–46.0)
Lymphs Abs: 3.6 10*3/uL (ref 0.7–4.0)
MCHC: 32.8 g/dL (ref 30.0–36.0)
MCV: 86 fl (ref 78.0–100.0)
Monocytes Absolute: 0.4 10*3/uL (ref 0.1–1.0)
Monocytes Relative: 5.4 % (ref 3.0–12.0)
Neutro Abs: 2.7 10*3/uL (ref 1.4–7.7)
Neutrophils Relative %: 38.9 % — ABNORMAL LOW (ref 43.0–77.0)
Platelets: 239 10*3/uL (ref 150.0–400.0)
RBC: 4.56 Mil/uL (ref 3.87–5.11)
RDW: 14.1 % (ref 11.5–15.5)
WBC: 6.8 10*3/uL (ref 4.0–10.5)

## 2024-01-15 LAB — LIPID PANEL
Cholesterol: 171 mg/dL (ref 0–200)
HDL: 68 mg/dL (ref >39.00)
LDL Cholesterol: 91 mg/dL (ref 0–99)
NonHDL: 102.61
Total CHOL/HDL Ratio: 3
Triglycerides: 59 mg/dL (ref 0.0–149.0)
VLDL: 11.8 mg/dL (ref 0.0–40.0)

## 2024-01-15 LAB — TSH: TSH: 2.57 u[IU]/mL (ref 0.35–5.50)

## 2024-01-23 ENCOUNTER — Encounter: Payer: Self-pay | Admitting: Family Medicine

## 2024-03-26 ENCOUNTER — Ambulatory Visit: Payer: No Typology Code available for payment source | Admitting: Family Medicine

## 2024-04-02 ENCOUNTER — Encounter: Payer: Self-pay | Admitting: Family Medicine

## 2024-04-02 ENCOUNTER — Ambulatory Visit (INDEPENDENT_AMBULATORY_CARE_PROVIDER_SITE_OTHER): Admitting: Family Medicine

## 2024-04-02 VITALS — BP 130/78 | HR 64 | Temp 98.0°F | Resp 18 | Ht 62.5 in | Wt 207.0 lb

## 2024-04-02 DIAGNOSIS — E785 Hyperlipidemia, unspecified: Secondary | ICD-10-CM | POA: Diagnosis not present

## 2024-04-02 DIAGNOSIS — I10 Essential (primary) hypertension: Secondary | ICD-10-CM | POA: Diagnosis not present

## 2024-04-02 DIAGNOSIS — R600 Localized edema: Secondary | ICD-10-CM | POA: Diagnosis not present

## 2024-04-02 DIAGNOSIS — Z0000 Encounter for general adult medical examination without abnormal findings: Secondary | ICD-10-CM

## 2024-04-02 MED ORDER — AMLODIPINE BESYLATE 10 MG PO TABS: 10.0000 mg | ORAL_TABLET | Freq: Every day | ORAL | 1 refills | Status: DC

## 2024-04-02 MED ORDER — ROSUVASTATIN CALCIUM 10 MG PO TABS: 10.0000 mg | ORAL_TABLET | Freq: Every evening | ORAL | 1 refills | Status: DC

## 2024-04-02 MED ORDER — METOPROLOL SUCCINATE ER 200 MG PO TB24: 200.0000 mg | ORAL_TABLET | Freq: Every day | ORAL | 1 refills | Status: DC

## 2024-04-02 MED ORDER — HYDROCHLOROTHIAZIDE 25 MG PO TABS: 25.0000 mg | ORAL_TABLET | Freq: Every day | ORAL | 3 refills | Status: AC

## 2024-04-02 NOTE — Patient Instructions (Signed)

## 2024-04-02 NOTE — Progress Notes (Addendum)
 Established Patient Office Visit  Subjective   Patient ID: Jodi Rodriguez, female    DOB: 1961-07-25  Age: 63 y.o. MRN: [id number]  Chief Complaint  Patient presents with   Hypertension   Hyperlipidemia   Follow-up    HPI Discussed the use of AI scribe software for clinical note transcription with the patient, who gave verbal consent to proceed.  History of Present Illness Jodi Rodriguez is a 63 year old female with hypertension who presents with increased swelling in her feet.  She has experienced increased swelling in her feet over the past three weeks, which has become more pronounced recently, possibly due to the summer heat. She is currently taking amlodipine  10 mg, which was increased about a year ago, metoprolol , and rosuvastatin . No shortness of breath is noted, and she does not consume a lot of salt. Her hands also swell at the end of her walks, and she tries to alleviate this by holding them up.  She reports joint pain, specifically in her wrist and elbow, and has a history of a small tear in the medial meniscus of her knee. She opted not to receive an injection for her knee pain and is not currently undergoing physical therapy.   Patient Active Problem List   Diagnosis Date Noted   Sleep apnea 09/20/2022   Epigastric abdominal tenderness without rebound tenderness 09/19/2021   Preventative health care 06/08/2019   Hot flashes due to menopause 01/02/2017   Postmenopausal bleeding 10/08/2016   Finger wound, simple, open 11/25/2013   Obesity (BMI 30-39.9) 07/28/2013   Fibroid    UTI 08/15/2010   Anxiety 04/21/2009   CERVICAL LYMPHADENOPATHY, LEFT 02/25/2008   Impacted cerumen 06/09/2007   Hyperlipidemia 02/20/2007   Essential hypertension 02/20/2007   Past Medical History:  Diagnosis Date   Anxiety    Fibroid    16 WK SIZE UT   Hyperlipidemia    Hypertension    Sleep apnea    [facility]- CPAP   Past Surgical History:  Procedure Laterality  Date   BREAST BIOPSY  1981   BREAST BIOPSY   Social History   Tobacco Use   Smoking status: Never   Smokeless tobacco: Never  Vaping Use   Vaping status: Never Used  Substance Use Topics   Alcohol use: Yes    Comment: occas   Drug use: No   Social History   Socioeconomic History   Marital status: Married    Spouse name: Not on file   Number of children: Not on file   Years of education: Not on file   Highest education level: Not on file  Occupational History   Occupation: Insurance  Tobacco Use   Smoking status: Never   Smokeless tobacco: Never  Vaping Use   Vaping status: Never Used  Substance and Sexual Activity   Alcohol use: Yes    Comment: occas   Drug use: No   Sexual activity: Not Currently    Partners: Male    Birth control/protection: Other-see comments, Post-menopausal    Comment: vasectomy- fewer than 5 partners first sexual  active at age 36  Other Topics Concern   Not on file  Social History Narrative   Exercise-- 2-4x a week -- walking    Social Drivers of Corporate investment banker Strain: Not on file  Food Insecurity: Not on file  Transportation Needs: Not on file  Physical Activity: Not on file  Stress: Not on file  Social Connections: Not  on file  Intimate Partner Violence: Not on file   Family Status  Relation Name Status   Sister  Alive   Father  Deceased   Mother  Deceased   Other  (Not Specified)  No partnership data on file   Family History  Problem Relation Age of Onset   Thyroid  disease Sister    Hypertension Sister    Diabetes Father    Hypertension Father    Hypertension Mother    Alzheimer's disease Mother    Alzheimer's disease Other        and neurological disorder   No Known Allergies    Review of Systems  Constitutional:  Negative for chills, fever and malaise/fatigue.  HENT:  Negative for congestion and hearing loss.   Eyes:  Negative for blurred vision and discharge.  Respiratory:  Negative for cough,  sputum production and shortness of breath.   Cardiovascular:  Negative for chest pain, palpitations and leg swelling.  Gastrointestinal:  Negative for abdominal pain, blood in stool, constipation, diarrhea, heartburn, nausea and vomiting.  Genitourinary:  Negative for dysuria, frequency, hematuria and urgency.  Musculoskeletal:  Negative for back pain, falls and myalgias.  Skin:  Negative for rash.  Neurological:  Negative for dizziness, sensory change, loss of consciousness, weakness and headaches.  Endo/Heme/Allergies:  Negative for environmental allergies. Does not bruise/bleed easily.  Psychiatric/Behavioral:  Negative for depression and suicidal ideas. The patient is not nervous/anxious and does not have insomnia.       Objective:     BP 130/78 (BP Location: Left Arm, Patient Position: Sitting, Cuff Size: Large)   Pulse 64   Temp 98 F (36.7 C) (Oral)   Resp 18   Ht 5' 2.5" (1.588 m)   Wt 207 lb (93.9 kg)   LMP 02/26/2014   SpO2 96%   BMI 37.26 kg/m  BP Readings from Last 3 Encounters:  04/02/24 130/78  10/01/23 (!) 144/88  09/26/23 132/80   Wt Readings from Last 3 Encounters:  04/02/24 207 lb (93.9 kg)  10/01/23 206 lb (93.4 kg)  09/26/23 205 lb 6.4 oz (93.2 kg)   SpO2 Readings from Last 3 Encounters:  04/02/24 96%  10/01/23 97%  09/26/23 97%      Physical Exam Vitals and nursing note reviewed.  Constitutional:      General: She is not in acute distress.    Appearance: Normal appearance. She is well-developed.  HENT:     Head: Normocephalic and atraumatic.  Eyes:     General: No scleral icterus.       Right eye: No discharge.        Left eye: No discharge.  Cardiovascular:     Rate and Rhythm: Normal rate and regular rhythm.     Heart sounds: No murmur heard. Pulmonary:     Effort: Pulmonary effort is normal. No respiratory distress.     Breath sounds: No wheezing.     Comments: Duo neb given in neb Lungs cleared after  Musculoskeletal:         General: Normal range of motion.     Cervical back: Normal range of motion and neck supple.     Right lower leg: No tenderness. 2+ Pitting Edema present.     Left lower leg: No tenderness. 2+ Pitting Edema present.  Skin:    General: Skin is warm and dry.  Neurological:     Mental Status: She is alert and oriented to person, place, and time.  Psychiatric:  Mood and Affect: Mood normal.        Behavior: Behavior normal.        Thought Content: Thought content normal.        Judgment: Judgment normal.      No results found for any visits on 04/02/24.  Last CBC Lab Results  Component Value Date   WBC 6.8 01/15/2024   HGB 12.9 01/15/2024   HCT 39.3 01/15/2024   MCV 86.0 01/15/2024   MCH 28.1 03/29/2023   RDW 14.1 01/15/2024   PLT 239.0 01/15/2024   Last metabolic panel Lab Results  Component Value Date   GLUCOSE 103 (H) 01/15/2024   NA 143 01/15/2024   K 3.9 01/15/2024   CL 108 01/15/2024   CO2 26 01/15/2024   BUN 19 01/15/2024   CREATININE 0.79 01/15/2024   GFR 81.30 01/15/2024   CALCIUM  9.6 01/15/2024   PROT 7.2 01/15/2024   ALBUMIN 4.4 01/15/2024   BILITOT 0.4 01/15/2024   ALKPHOS 67 01/15/2024   AST 19 01/15/2024   ALT 17 01/15/2024   Last lipids Lab Results  Component Value Date   CHOL 171 01/15/2024   HDL 68.00 01/15/2024   LDLCALC 91 01/15/2024   LDLDIRECT 151.1 07/28/2013   TRIG 59.0 01/15/2024   CHOLHDL 3 01/15/2024   Last hemoglobin A1c No results found for: "HGBA1C" Last thyroid  functions Lab Results  Component Value Date   TSH 2.57 01/15/2024   Last vitamin D  Lab Results  Component Value Date   VD25OH 37.26 03/29/2023   Last vitamin B12 and Folate Lab Results  Component Value Date   VITAMINB12 668 03/29/2023   FOLATE >20.0 ng/mL 04/21/2009      The 10-year ASCVD risk score (Arnett DK, et al., 2019) is: 5.5%    Assessment & Plan:   Problem List Items Addressed This Visit       Unprioritized   Preventative health care    Relevant Medications   amLODipine  (NORVASC ) 10 MG tablet   metoprolol  (TOPROL -XL) 200 MG 24 hr tablet   Hyperlipidemia   Relevant Medications   amLODipine  (NORVASC ) 10 MG tablet   metoprolol  (TOPROL -XL) 200 MG 24 hr tablet   rosuvastatin  (CRESTOR ) 10 MG tablet   hydrochlorothiazide (HYDRODIURIL) 25 MG tablet   Other Relevant Orders   Comprehensive metabolic panel with GFR   Lipid panel   Essential hypertension   Relevant Medications   amLODipine  (NORVASC ) 10 MG tablet   metoprolol  (TOPROL -XL) 200 MG 24 hr tablet   rosuvastatin  (CRESTOR ) 10 MG tablet   hydrochlorothiazide (HYDRODIURIL) 25 MG tablet   Other Relevant Orders   Comprehensive metabolic panel with GFR   Lipid panel   Other Visit Diagnoses       Lower extremity edema    -  Primary   Relevant Medications   hydrochlorothiazide (HYDRODIURIL) 25 MG tablet     Assessment and Plan Assessment & Plan Peripheral edema   Peripheral edema in the feet has worsened over the past three weeks, possibly due to summer heat and amlodipine  use. There is no shortness of breath, reducing concern for cardiac-related fluid accumulation. Start hydrochlorothiazide once daily in the morning to reduce fluid retention, with a discussion on its potential side effect of hypokalemia, which can affect cardiac rhythms. Monitor salt intake, increase water consumption, and wear compression hose, acknowledging discomfort due to heat. Consume potassium-rich foods to counteract potential hypokalemia and report any leg cramps. Re-evaluate in a few weeks to assess improvement.  Hypertension   Blood pressure  is slightly elevated today compared to previous readings. Continue the current antihypertensive regimen with amlodipine  and metoprolol . Monitor blood pressure regularly.  Meniscal tear   A small tear of the medial meniscus was diagnosed previously. She opted against injection therapy and is not pursuing surgical intervention due to time constraints.  Consider physical therapy as a non-surgical management option.    Return in about 3 months (around 07/03/2024), or if symptoms worsen or fail to improve.    Kemberly Taves R Lowne Chase, DO

## 2024-04-03 LAB — COMPREHENSIVE METABOLIC PANEL WITH GFR
ALT: 22 U/L (ref 0–35)
AST: 26 U/L (ref 0–37)
Albumin: 4.4 g/dL (ref 3.5–5.2)
Alkaline Phosphatase: 64 U/L (ref 39–117)
BUN: 13 mg/dL (ref 6–23)
CO2: 26 meq/L (ref 19–32)
Calcium: 9.7 mg/dL (ref 8.4–10.5)
Chloride: 106 meq/L (ref 96–112)
Creatinine, Ser: 0.99 mg/dL (ref 0.40–1.20)
GFR: 61.92 mL/min (ref >60.00)
Glucose, Bld: 99 mg/dL (ref 70–99)
Potassium: 3.5 meq/L (ref 3.5–5.1)
Sodium: 141 meq/L (ref 135–145)
Total Bilirubin: 0.4 mg/dL (ref 0.2–1.2)
Total Protein: 7.5 g/dL (ref 6.0–8.3)

## 2024-04-03 LAB — LIPID PANEL
Cholesterol: 165 mg/dL (ref 0–200)
HDL: 69 mg/dL (ref >39.00)
LDL Cholesterol: 86 mg/dL (ref 0–99)
NonHDL: 95.72
Total CHOL/HDL Ratio: 2
Triglycerides: 50 mg/dL (ref 0.0–149.0)
VLDL: 10 mg/dL (ref 0.0–40.0)

## 2024-04-12 ENCOUNTER — Ambulatory Visit: Payer: Self-pay | Admitting: Family Medicine

## 2024-09-14 ENCOUNTER — Encounter: Payer: Self-pay | Admitting: Radiology

## 2024-09-29 ENCOUNTER — Encounter: Payer: Self-pay | Admitting: Family Medicine

## 2024-09-29 ENCOUNTER — Ambulatory Visit: Payer: No Typology Code available for payment source | Admitting: Family Medicine

## 2024-09-29 ENCOUNTER — Ambulatory Visit: Payer: Self-pay | Admitting: Family Medicine

## 2024-09-29 VITALS — BP 118/80 | HR 60 | Temp 98.1°F | Resp 16 | Ht 62.5 in | Wt 204.4 lb

## 2024-09-29 DIAGNOSIS — I10 Essential (primary) hypertension: Secondary | ICD-10-CM | POA: Diagnosis not present

## 2024-09-29 DIAGNOSIS — R3915 Urgency of urination: Secondary | ICD-10-CM | POA: Diagnosis not present

## 2024-09-29 DIAGNOSIS — Z0000 Encounter for general adult medical examination without abnormal findings: Secondary | ICD-10-CM

## 2024-09-29 DIAGNOSIS — E785 Hyperlipidemia, unspecified: Secondary | ICD-10-CM | POA: Diagnosis not present

## 2024-09-29 DIAGNOSIS — Z23 Encounter for immunization: Secondary | ICD-10-CM | POA: Diagnosis not present

## 2024-09-29 LAB — COMPREHENSIVE METABOLIC PANEL WITH GFR
ALT: 21 U/L (ref 0–35)
AST: 21 U/L (ref 0–37)
Albumin: 4.6 g/dL (ref 3.5–5.2)
Alkaline Phosphatase: 68 U/L (ref 39–117)
BUN: 15 mg/dL (ref 6–23)
CO2: 28 meq/L (ref 19–32)
Calcium: 9.6 mg/dL (ref 8.4–10.5)
Chloride: 106 meq/L (ref 96–112)
Creatinine, Ser: 0.8 mg/dL (ref 0.40–1.20)
GFR: 79.68 mL/min (ref >60.00)
Glucose, Bld: 114 mg/dL — ABNORMAL HIGH (ref 70–99)
Potassium: 3.9 meq/L (ref 3.5–5.1)
Sodium: 142 meq/L (ref 135–145)
Total Bilirubin: 0.5 mg/dL (ref 0.2–1.2)
Total Protein: 7.6 g/dL (ref 6.0–8.3)

## 2024-09-29 LAB — CBC WITH DIFFERENTIAL/PLATELET
Basophils Absolute: 0 K/uL (ref 0.0–0.1)
Basophils Relative: 0.2 % (ref 0.0–3.0)
Eosinophils Absolute: 0.1 K/uL (ref 0.0–0.7)
Eosinophils Relative: 1.5 % (ref 0.0–5.0)
HCT: 41.2 % (ref 36.0–46.0)
Hemoglobin: 13.5 g/dL (ref 12.0–15.0)
Lymphocytes Relative: 54 % — ABNORMAL HIGH (ref 12.0–46.0)
Lymphs Abs: 3.1 K/uL (ref 0.7–4.0)
MCHC: 32.9 g/dL (ref 30.0–36.0)
MCV: 85.9 fl (ref 78.0–100.0)
Monocytes Absolute: 0.3 K/uL (ref 0.1–1.0)
Monocytes Relative: 4.8 % (ref 3.0–12.0)
Neutro Abs: 2.3 K/uL (ref 1.4–7.7)
Neutrophils Relative %: 39.5 % — ABNORMAL LOW (ref 43.0–77.0)
Platelets: 247 K/uL (ref 150.0–400.0)
RBC: 4.8 Mil/uL (ref 3.87–5.11)
RDW: 14.6 % (ref 11.5–15.5)
WBC: 5.8 K/uL (ref 4.0–10.5)

## 2024-09-29 LAB — POC URINALSYSI DIPSTICK (AUTOMATED)
Bilirubin, UA: NEGATIVE
Blood, UA: NEGATIVE
Glucose, UA: NEGATIVE
Ketones, UA: NEGATIVE
Leukocytes, UA: NEGATIVE
Nitrite, UA: NEGATIVE
Protein, UA: NEGATIVE
Spec Grav, UA: 1.02 (ref 1.010–1.025)
Urobilinogen, UA: 0.2 U/dL
pH, UA: 6 (ref 5.0–8.0)

## 2024-09-29 LAB — LIPID PANEL
Cholesterol: 171 mg/dL (ref 0–200)
HDL: 70.4 mg/dL (ref >39.00)
LDL Cholesterol: 92 mg/dL (ref 0–99)
NonHDL: 101.06
Total CHOL/HDL Ratio: 2
Triglycerides: 47 mg/dL (ref 0.0–149.0)
VLDL: 9.4 mg/dL (ref 0.0–40.0)

## 2024-09-29 LAB — TSH: TSH: 1.71 u[IU]/mL (ref 0.35–5.50)

## 2024-09-29 MED ORDER — METOPROLOL SUCCINATE ER 200 MG PO TB24: 200.0000 mg | ORAL_TABLET | Freq: Every day | ORAL | 1 refills | Status: AC

## 2024-09-29 MED ORDER — AMLODIPINE BESYLATE 10 MG PO TABS: 10.0000 mg | ORAL_TABLET | Freq: Every day | ORAL | 1 refills | Status: AC

## 2024-09-29 MED ORDER — ROSUVASTATIN CALCIUM 10 MG PO TABS: 10.0000 mg | ORAL_TABLET | Freq: Every evening | ORAL | 1 refills | Status: AC

## 2024-09-29 NOTE — Assessment & Plan Note (Signed)
 Ghm utd Check labs  See [facility] Health Maintenance  Topic Date Due   Mammogram  11/15/2022   Cervical Cancer Screening (HPV/Pap Cotest)  08/03/2024   COVID-19 Vaccine (3 - Pfizer risk series) 10/15/2024 (Originally 04/25/2020)   Zoster Vaccines- Shingrix (1 of 2) 12/30/2024 (Originally 08/10/1982)   Influenza Vaccine  02/09/2025 (Originally 06/12/2024)   Pneumococcal Vaccine: 50+ Years (1 of 1 - PCV) 09/29/2025 (Originally 08/10/2013)   Colonoscopy  05/09/2027   DTaP/Tdap/Td (4 - Td or Tdap) 09/29/2034   Hepatitis C Screening  Completed   HIV Screening  Completed   Hepatitis B Vaccines 19-59 Average Risk  Aged Out   HPV VACCINES  Aged Out   Meningococcal B Vaccine  Aged Out

## 2024-09-29 NOTE — Assessment & Plan Note (Signed)
 Well controlled, no changes to meds. Encouraged heart healthy diet such as the DASH diet and exercise as tolerated.

## 2024-09-29 NOTE — Progress Notes (Signed)
 Subjective:    Patient ID: Jodi Rodriguez, female    DOB: [birth year], 63 y.o.   MRN: [id number]  Chief Complaint  Patient presents with   Annual Exam    Pt states fasting     HPI Patient is in today for cpe   Discussed the use of AI scribe software for clinical note transcription with the patient, who gave verbal consent to proceed.  History of Present Illness Jodi Rodriguez is a 63 year old female who presents for an annual physical exam.  She typically fills her medication at [facility] [place].  She has not received the shingles vaccine. Her husband, who is ten years older, has received multiple doses. She has not received a flu shot and is due for a tetanus booster. She spends a lot of time outdoors and walks frequently.  She has a history of a tear in the medial meniscus. Surgery was recommended, but she has been postponing it. She did not attend physical therapy, believing her regular exercise suffices. The knee has been less painful but has become achy with the colder weather. She recalls having an MRI last February and is uncertain about the current status of the tear. She experiences difficulty with stairs but continues to walk and do steps.  She feels generally achy over the past month, attributing it to the cold weather. No significant stomach issues and she is not following any specific diet.  She provided a urine sample due to noticing cloudiness and experiencing urgency.   Past Medical History:  Diagnosis Date   Anxiety    Fibroid    16 WK SIZE UT   Hyperlipidemia    Hypertension    Sleep apnea    [facility]- CPAP    Past Surgical History:  Procedure Laterality Date   BREAST BIOPSY  1981   BREAST BIOPSY    Family History  Problem Relation Age of Onset   Thyroid  disease Sister    Hypertension Sister    Diabetes Father    Hypertension Father    Hypertension Mother    Alzheimer's disease Mother    Alzheimer's disease Other        and  neurological disorder    Social History   Socioeconomic History   Marital status: Married    Spouse name: Not on file   Number of children: Not on file   Years of education: Not on file   Highest education level: Not on file  Occupational History   Occupation: Insurance  Tobacco Use   Smoking status: Never   Smokeless tobacco: Never  Vaping Use   Vaping status: Never Used  Substance and Sexual Activity   Alcohol use: Yes    Comment: occas   Drug use: No   Sexual activity: Not Currently    Partners: Male    Birth control/protection: Other-see comments, Post-menopausal    Comment: vasectomy- fewer than 5 partners first sexual  active at age 56  Other Topics Concern   Not on file  Social History Narrative   Exercise-- 2-4x a week -- walking    Social Drivers of Corporate Investment Banker Strain: Not on file  Food Insecurity: Not on file  Transportation Needs: Not on file  Physical Activity: Not on file  Stress: Not on file  Social Connections: Not on file  Intimate Partner Violence: Not on file    Outpatient Medications Prior to Visit  Medication Sig Dispense Refill   Ascorbic Acid (VITAMIN  C PO) Take by mouth daily.       hydrochlorothiazide  (HYDRODIURIL ) 25 MG tablet Take 1 tablet (25 mg total) by mouth daily. 90 tablet 3   Multiple Vitamin (MULTIVITAMIN) tablet Take 1 tablet by mouth daily.       amLODipine  (NORVASC ) 10 MG tablet Take 1 tablet (10 mg total) by mouth daily. 90 tablet 1   metoprolol  (TOPROL -XL) 200 MG 24 hr tablet Take 1 tablet (200 mg total) by mouth daily. 90 tablet 1   rosuvastatin  (CRESTOR ) 10 MG tablet Take 1 tablet (10 mg total) by mouth at bedtime. 90 tablet 1   No facility-administered medications prior to visit.    No Known Allergies  Review of Systems  Constitutional:  Negative for chills, fever and malaise/fatigue.  HENT:  Negative for congestion and hearing loss.   Eyes:  Negative for blurred vision and discharge.  Respiratory:   Negative for cough, sputum production and shortness of breath.   Cardiovascular:  Negative for chest pain, palpitations and leg swelling.  Gastrointestinal:  Negative for abdominal pain, blood in stool, constipation, diarrhea, heartburn, nausea and vomiting.  Genitourinary:  Positive for urgency. Negative for dysuria, frequency and hematuria.  Musculoskeletal:  Negative for back pain, falls and myalgias.  Skin:  Negative for rash.  Neurological:  Negative for dizziness, sensory change, loss of consciousness, weakness and headaches.  Endo/Heme/Allergies:  Negative for environmental allergies. Does not bruise/bleed easily.  Psychiatric/Behavioral:  Negative for depression and suicidal ideas. The patient is not nervous/anxious and does not have insomnia.        Objective:    Physical Exam Vitals and nursing note reviewed.  Constitutional:      General: She is not in acute distress.    Appearance: Normal appearance. She is well-developed.  HENT:     Head: Normocephalic and atraumatic.     Right Ear: Tympanic membrane, ear canal and external ear normal. There is no impacted cerumen.     Left Ear: Tympanic membrane, ear canal and external ear normal. There is no impacted cerumen.     Nose: Nose normal.     Mouth/Throat:     Mouth: Mucous membranes are moist.     Pharynx: Oropharynx is clear. No oropharyngeal exudate or posterior oropharyngeal erythema.  Eyes:     General: No scleral icterus.       Right eye: No discharge.        Left eye: No discharge.     Conjunctiva/sclera: Conjunctivae normal.     Pupils: Pupils are equal, round, and reactive to light.  Neck:     Thyroid : No thyromegaly or thyroid  tenderness.     Vascular: No JVD.  Cardiovascular:     Rate and Rhythm: Normal rate and regular rhythm.     Heart sounds: Normal heart sounds. No murmur heard. Pulmonary:     Effort: Pulmonary effort is normal. No respiratory distress.     Breath sounds: Normal breath sounds.   Abdominal:     General: Bowel sounds are normal. There is no distension.     Palpations: Abdomen is soft. There is no mass.     Tenderness: There is no abdominal tenderness. There is no guarding or rebound.  Genitourinary:    Vagina: Normal.  Musculoskeletal:        General: Normal range of motion.     Cervical back: Normal range of motion and neck supple.     Right lower leg: No edema.     Left lower  leg: No edema.  Lymphadenopathy:     Cervical: No cervical adenopathy.  Skin:    General: Skin is warm and dry.     Findings: No erythema or rash.  Neurological:     Mental Status: She is alert and oriented to person, place, and time.     Cranial Nerves: No cranial nerve deficit.     Deep Tendon Reflexes: Reflexes are normal and symmetric.  Psychiatric:        Mood and Affect: Mood normal.        Behavior: Behavior normal.        Thought Content: Thought content normal.        Judgment: Judgment normal.     BP 118/80 (BP Location: Left Arm, Patient Position: Sitting, Cuff Size: Large)   Pulse 60   Temp 98.1 F (36.7 C) (Oral)   Resp 16   Ht 5' 2.5 (1.588 m)   Wt 204 lb 6.4 oz (92.7 kg)   LMP 02/26/2014   SpO2 95%   BMI 36.79 kg/m  Wt Readings from Last 3 Encounters:  09/29/24 204 lb 6.4 oz (92.7 kg)  04/02/24 207 lb (93.9 kg)  10/01/23 206 lb (93.4 kg)    Diabetic Foot Exam - Simple   No data filed    Lab Results  Component Value Date   WBC 6.8 01/15/2024   HGB 12.9 01/15/2024   HCT 39.3 01/15/2024   PLT 239.0 01/15/2024   GLUCOSE 99 04/02/2024   CHOL 165 04/02/2024   TRIG 50.0 04/02/2024   HDL 69.00 04/02/2024   LDLDIRECT 151.1 07/28/2013   LDLCALC 86 04/02/2024   ALT 22 04/02/2024   AST 26 04/02/2024   NA 141 04/02/2024   K 3.5 04/02/2024   CL 106 04/02/2024   CREATININE 0.99 04/02/2024   BUN 13 04/02/2024   CO2 26 04/02/2024   TSH 2.57 01/15/2024   MICROALBUR 0.3 07/07/2020    Lab Results  Component Value Date   TSH 2.57 01/15/2024   Lab  Results  Component Value Date   WBC 6.8 01/15/2024   HGB 12.9 01/15/2024   HCT 39.3 01/15/2024   MCV 86.0 01/15/2024   PLT 239.0 01/15/2024   Lab Results  Component Value Date   NA 141 04/02/2024   K 3.5 04/02/2024   CO2 26 04/02/2024   GLUCOSE 99 04/02/2024   BUN 13 04/02/2024   CREATININE 0.99 04/02/2024   BILITOT 0.4 04/02/2024   ALKPHOS 64 04/02/2024   AST 26 04/02/2024   ALT 22 04/02/2024   PROT 7.5 04/02/2024   ALBUMIN 4.4 04/02/2024   CALCIUM  9.7 04/02/2024   GFR 61.92 04/02/2024   Lab Results  Component Value Date   CHOL 165 04/02/2024   Lab Results  Component Value Date   HDL 69.00 04/02/2024   Lab Results  Component Value Date   LDLCALC 86 04/02/2024   Lab Results  Component Value Date   TRIG 50.0 04/02/2024   Lab Results  Component Value Date   CHOLHDL 2 04/02/2024   No results found for: HGBA1C     Assessment & Plan:  Essential hypertension Assessment & Plan: Well controlled, no changes to meds. Encouraged heart healthy diet such as the DASH diet and exercise as tolerated.    Orders: -     amLODIPine  Besylate; Take 1 tablet (10 mg total) by mouth daily.  Dispense: 90 tablet; Refill: 1 -     Metoprolol  Succinate ER; Take 1 tablet (200 mg total) by  mouth daily.  Dispense: 90 tablet; Refill: 1  Preventative health care Assessment & Plan: Ghm utd Check labs  See AVS Health Maintenance  Topic Date Due   Mammogram  11/15/2022   Cervical Cancer Screening (HPV/Pap Cotest)  08/03/2024   COVID-19 Vaccine (3 - Pfizer risk series) 10/15/2024 (Originally 04/25/2020)   Zoster Vaccines- Shingrix (1 of 2) 12/30/2024 (Originally 08/10/1982)   Influenza Vaccine  02/09/2025 (Originally 06/12/2024)   Pneumococcal Vaccine: 50+ Years (1 of 1 - PCV) 09/29/2025 (Originally 08/10/2013)   Colonoscopy  05/09/2027   DTaP/Tdap/Td (4 - Td or Tdap) 09/29/2034   Hepatitis C Screening  Completed   HIV Screening  Completed   Hepatitis B Vaccines 19-59 Average Risk   Aged Out   HPV VACCINES  Aged Out   Meningococcal B Vaccine  Aged Out     Orders: -     amLODIPine  Besylate; Take 1 tablet (10 mg total) by mouth daily.  Dispense: 90 tablet; Refill: 1 -     Metoprolol  Succinate ER; Take 1 tablet (200 mg total) by mouth daily.  Dispense: 90 tablet; Refill: 1 -     CBC with Differential/Platelet -     Comprehensive metabolic panel with GFR -     Lipid panel -     TSH  Hyperlipidemia, unspecified hyperlipidemia type Assessment & Plan: Encourage heart healthy diet such as MIND or DASH diet, increase exercise, avoid trans fats, simple carbohydrates and processed foods, consider a krill or fish or flaxseed oil cap daily.    Orders: -     Rosuvastatin  Calcium ; Take 1 tablet (10 mg total) by mouth at bedtime.  Dispense: 90 tablet; Refill: 1  Urgency of urination -     POCT Urinalysis Dipstick (Automated)  Need for Tdap vaccination -     Tdap vaccine greater than or equal to 7yo IM   Assessment and Plan Assessment & Plan Adult Wellness Visit   During her routine adult wellness visit, her blood pressure is well-controlled. There are no significant changes in family history, stomach issues, or dietary habits. She has not received COVID-19, shingles, or flu vaccinations and is due for a tetanus vaccination. A mammogram was not performed at the [facility] office, and a bone density scan is due next November. She received the TDAP vaccine, lab tests were performed, and she was encouraged to get shingles and flu vaccinations.  Immunization Update (Tetanus)   She was due for a tetanus vaccination and agreed to receive the TDAP vaccine today, which was administered.  Urgency of urination   She reports urgency of urination and cloudy urine. A urine sample was provided and analyzed.  Knee meniscal tear   She has a medial meniscus tear. Symptoms improved but worsened with colder weather. She has not attended physical therapy, and surgery is postponed due to work  commitments. Surgery for the knee meniscal tear remains under consideration.  Essential hypertension   Her blood pressure is well-controlled.  Hyperlipidemia   There was no specific discussion regarding hyperlipidemia management during this encounter.   Syncere Kaminski R Lowne Chase, DO

## 2024-09-29 NOTE — Assessment & Plan Note (Signed)
 Encourage heart healthy diet such as MIND or DASH diet, increase exercise, avoid trans fats, simple carbohydrates and processed foods, consider a krill or fish or flaxseed oil cap daily.

## 2024-10-01 ENCOUNTER — Ambulatory Visit: Payer: No Typology Code available for payment source | Admitting: Nurse Practitioner

## 2024-10-01 ENCOUNTER — Other Ambulatory Visit (HOSPITAL_COMMUNITY)
Admission: RE | Admit: 2024-10-01 | Discharge: 2024-10-01 | Disposition: A | Source: Ambulatory Visit | Attending: Nurse Practitioner | Admitting: Nurse Practitioner

## 2024-10-01 ENCOUNTER — Encounter: Payer: Self-pay | Admitting: Nurse Practitioner

## 2024-10-01 VITALS — BP 120/68 | HR 65 | Ht 61.75 in | Wt 207.0 lb

## 2024-10-01 DIAGNOSIS — Z124 Encounter for screening for malignant neoplasm of cervix: Secondary | ICD-10-CM | POA: Insufficient documentation

## 2024-10-01 DIAGNOSIS — Z01419 Encounter for gynecological examination (general) (routine) without abnormal findings: Secondary | ICD-10-CM

## 2024-10-01 DIAGNOSIS — Z1331 Encounter for screening for depression: Secondary | ICD-10-CM

## 2024-10-01 DIAGNOSIS — Z780 Asymptomatic menopausal state: Secondary | ICD-10-CM | POA: Diagnosis not present

## 2024-10-01 NOTE — Progress Notes (Signed)
   Jodi Rodriguez 1960/11/23 [id number]   History:  63 y.o. G2P2 presents for annual exam. No GYN complaints. Postmenopausal - no HRT, no bleeding. Normal pap history. HTN, HLD managed by PCP.   Gynecologic History Patient's last menstrual period was 02/26/2014.   Contraception: post menopausal status Sexually active: Yes  Health maintenance Last Pap: 08/04/2019. Results were: Normal neg HPV Last mammogram: 11/15/2021. Results were: Normal Last colonoscopy: 05/08/2017. Results were: Normal, 10-year recall Last Dexa: 10/03/2023. Results were: Normal     10/01/2024    3:00 PM  Depression screen PHQ 2/9  Decreased Interest 0  Down, Depressed, Hopeless 0  PHQ - 2 Score 0     Past medical history, past surgical history, family history and social history were all reviewed and documented in the EPIC chart. Married. Works in office at [employer]. Son lives in [place]. Daughter in [place].   ROS:  A ROS was performed and pertinent positives and negatives are included.  Exam:  Vitals:   10/01/24 1457  BP: 120/68  Pulse: 65  SpO2: 99%  Weight: 207 lb (93.9 kg)  Height: 5' 1.75 (1.568 m)       Body mass index is 38.17 kg/m.  General appearance:  Normal Thyroid :  Symmetrical, normal in size, without palpable masses or nodularity. Respiratory  Auscultation:  Clear without wheezing or rhonchi Cardiovascular  Auscultation:  Regular rate, without rubs, murmurs or gallops  Edema/varicosities:  Not grossly evident Abdominal  Soft,nontender, without masses, guarding or rebound.  Liver/spleen:  No organomegaly noted  Hernia:  None appreciated  Skin  Inspection:  Grossly normal   Breasts: Examined lying and sitting.   Right: Without masses, retractions, discharge or axillary adenopathy.   Left: Without masses, retractions, discharge or axillary adenopathy. Pelvic: External genitalia:  no lesions              Urethra:  normal appearing urethra with no masses, tenderness or lesions               Bartholins and Skenes: normal                 Vagina: normal appearing vagina with normal color and discharge, no lesions              Cervix: no lesions Bimanual Exam:  Uterus:  no masses or tenderness              Adnexa: no mass, fullness, tenderness              Rectovaginal: Deferred              Anus:  normal, no lesions  Jodi Rodriguez, CMA present as chaperone.   Assessment/Plan:  63 y.o. G2P2 for annual exam.   Well female exam with routine gynecological exam - Education provided on SBEs, importance of preventative screenings, current guidelines, high calcium  diet, regular exercise, and multivitamin daily.  Labs with PCP.  Postmenopausal - no HRT, no bleeding.   Depression screening - PHQ - 0  Cervical cancer screening - Plan: Cytology - PAP( [facility]). Normal Pap history. Pap today per guidelines.   Screening for breast cancer - Normal mammogram history.  Continue annual screenings. Normal breast exam today.  Screening for colon cancer - 2018 colonoscopy. Will repeat at GI's recommended interval.   Screening for osteoporosis - Normal DXA 09/2023.  Return in about 1 year (around 10/01/2025) for Annual.      Jodi Rodriguez [facility], 3:21 PM 10/01/2024

## 2024-10-02 ENCOUNTER — Ambulatory Visit: Payer: Self-pay | Admitting: Nurse Practitioner

## 2024-10-02 LAB — CYTOLOGY - PAP
Comment: NEGATIVE
Diagnosis: NEGATIVE
High risk HPV: NEGATIVE

## 2024-10-06 ENCOUNTER — Ambulatory Visit: Admitting: Family Medicine

## 2025-03-29 ENCOUNTER — Ambulatory Visit: Admitting: Family Medicine

## 2025-09-30 ENCOUNTER — Encounter: Admitting: Family Medicine

## 2025-10-04 ENCOUNTER — Ambulatory Visit: Admitting: Nurse Practitioner
